# Patient Record
Sex: Female | Born: 1980 | Hispanic: Yes | Marital: Single
Health system: Midwestern US, Community
[De-identification: ages and names within clinical notes are randomized; demographics above are authoritative.]

## PROBLEM LIST (undated history)

## (undated) ENCOUNTER — Observation Stay: Admission: RE | Payer: Medicaid HMO | Source: Home / Self Care | Admitting: Obstetrics & Gynecology

## (undated) HISTORY — PX: APPENDECTOMY: SHX54

## (undated) HISTORY — PX: APPENDECTOMY (OPEN): SHX54

---

## 2007-12-06 LAB — URINALYSIS W/ RFLX MICROSCOPIC
Bilirubin: NEGATIVE
Blood: NEGATIVE
Glucose: NEGATIVE MG/DL
Leukocyte Esterase: NEGATIVE
Nitrites: NEGATIVE
Specific gravity: >1.03 — ABNORMAL HIGH (ref 1.003–1.030)
Urobilinogen: 1 EU/DL (ref 0.2–1.0)
pH (UA): 5.5 (ref 5.0–8.0)

## 2007-12-06 LAB — URINE MICROSCOPIC ONLY: WBC: 0 /HPF (ref 0–4)

## 2007-12-06 LAB — HCG URINE, QL: HCG urine, QL: NEGATIVE

## 2007-12-06 LAB — WET PREP: Wet prep: NONE SEEN

## 2007-12-08 LAB — CHLAMYDIA/GC PCR
Chlamydia amplified: NEGATIVE
N. gonorrhea, amplified: NEGATIVE

## 2010-10-20 NOTE — ED Notes (Signed)
Left rib pain for last 30 minutes, worse with movement or deep breath.

## 2010-10-20 NOTE — ED Notes (Signed)
MD at bedside

## 2010-10-20 NOTE — ED Provider Notes (Signed)
HPI Comments: 30 yo F presents to the ED C/O L sided rib pain that began earlier today. Pt states pain is worsened with deep breathing and twisiting torso to the left. Pt is 6 months pregnant and is followed by [facility]. Pt denies SOB and any other Sx or complaints.    Patient is a 30 y.o. female presenting with rib pain. The history is provided by the patient.   Rib Pain  This is a new problem. The current episode started 3 to 5 hours ago. The problem has not changed since onset.Pertinent negatives include no fever, no headaches, no rhinorrhea, no sore throat, no cough, no chest pain, no vomiting, no abdominal pain and no rash. She has tried nothing for the symptoms.        No past medical history on file.     Past Surgical History   Procedure Date   ??? Hx appendectomy          No family history on file.     History     Social History   ??? Marital Status: Single     Spouse Name: N/A     Number of Children: N/A   ??? Years of Education: N/A     Occupational History   ??? Not on file.     Social History Main Topics   ??? Smoking status: Not on file   ??? Smokeless tobacco: Not on file   ??? Alcohol Use:    ??? Drug Use:    ??? Sexually Active:      Other Topics Concern   ??? Not on file     Social History Narrative   ??? No narrative on file                  ALLERGIES: Review of patient's allergies indicates no known allergies.      Review of Systems   Constitutional: Negative for fever and fatigue.   HENT: Negative for sore throat and rhinorrhea.    Respiratory: Negative for cough and shortness of breath.    Cardiovascular: Negative for chest pain and palpitations.   Gastrointestinal: Negative for nausea, vomiting, abdominal pain and diarrhea.   Genitourinary: Negative for dysuria and difficulty urinating.   Musculoskeletal: Positive for myalgias and arthralgias.   Skin: Negative for color change and rash.   Neurological: Negative for light-headedness and headaches.   All other systems reviewed and are negative.        Filed Vitals:     10/20/10 2135 10/20/10 2229   BP: 101/61    Pulse: 78    Temp: 97.9 ??F (36.6 ??C)    Resp: 20    Height: 5\' 5"  (1.651 m)    Weight: 65.772 kg (145 lb)    SpO2: 100% 100%            Physical Exam   Nursing note and vitals reviewed.  Constitutional: She is oriented to person, place, and time. She appears well-developed and well-nourished.        Gravid F   HENT:   Head: Normocephalic and atraumatic.   Right Ear: External ear normal.   Left Ear: External ear normal.   Nose: Nose normal.   Mouth/Throat: Oropharynx is clear and moist.   Eyes: Conjunctivae and EOM are normal. Pupils are equal, round, and reactive to light.   Neck: Normal range of motion. Neck supple.   Cardiovascular: Normal rate, regular rhythm, normal heart sounds and intact distal pulses.  Pulmonary/Chest: Effort normal and breath sounds normal. No respiratory distress. She has no wheezes. She has no rales.        TTP over L lateral chest just above flank area, no swelling or induration   Abdominal: Soft. Bowel sounds are normal. She exhibits no distension. There is no tenderness. There is no rebound and no guarding.        Gravid abd, palpable fetal parts   Musculoskeletal: Normal range of motion. She exhibits no tenderness.   Neurological: She is alert and oriented to person, place, and time. She has normal reflexes.   Skin: Skin is warm and dry. No rash noted.   Psychiatric: She has a normal mood and affect. Her behavior is normal. Judgment and thought content normal.        MDM     Differential Diagnosis; Clinical Impression; Plan:     DDx:  Chest wall pain, GI, pleuritis, PTX    Gravid female with pleuritic left chest pain, no SOB. She had no relief with tylenol. She had relief with GI cocktail. She however was tender left chest wit palpation prior to medications. Pain is most likely chest wall pain, most likely pleurisy which is GI related.  Amount and/or Complexity of Data Reviewed:   Clinical lab tests:  Ordered and reviewed   Discussion of test results with the performing providers:  No   Decide to obtain previous medical records or to obtain history from someone other than the patient:  No   Obtain history from someone other than the patient:  No   Review and summarize past medical records:  Yes   Discuss the patient with another provider:  No   Independant visualization of image, tracing, or specimen:  Yes      Procedures    PROGRESS NOTES  Answered Pt and family questions. Written by Buel Ream, ED Scribe as dictated by Buelah Manis, MD, 10:18 PM    PROGRESS NOTE:   11:14 PM  Pt has been re-examined by Florestine Avers Salam Chesterfield, MD. Pt appears comfortable but states she still hurts when she moves, will try GI cocktail.  Written by Larey Days, ED Scribe, as dictated by Glynn Octave, MD.     PROGRESS NOTE:   11:48 PM  Pt has been re-examined by Buelah Manis, MD. Pt's pain resolved with GI cocktail, will D/C  Written by Larey Days, ED Scribe, as dictated by Glynn Octave, MD.       RESULTS  Labs Reviewed   CBC WITH AUTOMATED DIFF - Abnormal; Notable for the following:     RBC 3.23 (*)     HGB 10.3 (*)     HCT 29.6 (*)     All other components within normal limits   METABOLIC PANEL, BASIC - Abnormal; Notable for the following:     Sodium 132 (*)     All other components within normal limits   URINALYSIS W/ RFLX MICROSCOPIC       Recent Results (from the past 12 hour(s))   CBC WITH AUTOMATED DIFF    Collection Time    10/20/10 10:26 PM       Component Value Range    WBC 9.3  4.6 - 13.2 (K/uL)    RBC 3.23 (*) 4.20 - 5.30 (M/uL)    HGB 10.3 (*) 12.0 - 16.0 (g/dL)    HCT 16.1 (*) 09.6 - 45.0 (%)    MCV 91.6  74.0 - 97.0 (FL)  MCH 31.9  24.0 - 34.0 (PG)    MCHC 34.8  31.0 - 37.0 (g/dL)    RDW 09.8  11.9 - 14.7 (%)    PLATELET 183  135 - 420 (K/uL)    MPV 11.6  9.2 - 11.8 (FL)    NEUTROPHILS 66  40 - 73 (%)    LYMPHOCYTES 25  21 - 52 (%)    MONOCYTES 8  3 - 10 (%)    EOSINOPHILS 1  0 - 5 (%)     BASOPHILS 0  0 - 2 (%)    ABS. NEUTROPHILS 6.1  1.8 - 8.0 (K/UL)    ABS. LYMPHOCYTES 2.3  0.9 - 3.6 (K/UL)    ABS. MONOCYTES 0.8  0.05 - 1.2 (K/UL)    ABS. EOSINOPHILS 0.1  0.0 - 0.4 (K/UL)    ABS. BASOPHILS 0.0  0.0 - 0.06 (K/UL)    DF AUTOMATED     METABOLIC PANEL, BASIC    Collection Time    10/20/10 10:26 PM       Component Value Range    Sodium 132 (*) 136 - 145 (MMOL/L)    Potassium 3.6  3.5 - 5.5 (MMOL/L)    Chloride 102  100 - 108 (MMOL/L)    CO2 23  21 - 32 (MMOL/L)    Anion gap 7  5 - 15 (mmol/L)    Glucose 82  74 - 99 (MG/DL)    BUN 10  7 - 18 (MG/DL)    Creatinine 0.6  0.6 - 1.3 (MG/DL)    BUN/Creatinine ratio 17  12 - 20 ( )    GFR est AA >60  >60 (ml/min/1.22m2)    GFR est non-AA >60  >60 (ml/min/1.90m2)    Calcium 8.4  8.4 - 10.4 (MG/DL)   URINALYSIS W/ RFLX MICROSCOPIC    Collection Time    10/20/10 10:45 PM       Component Value Range    Color YELLOW      Appearance CLEAR      Specific gravity 1.025  1.003 - 1.030 ( )    pH 6.0  5.0 - 8.0 ( )    Protein NEGATIVE   NEGATIVE (MG/DL)    Glucose NEGATIVE   NEGATIVE (MG/DL)    Ketone NEGATIVE   NEGATIVE (MG/DL)    Bilirubin NEGATIVE   NEGATIVE     Blood NEGATIVE   NEGATIVE     Urobilinogen 0.2  0.2 - 1.0 (EU/DL)    Nitrites NEGATIVE   NEGATIVE     Leukocyte Esterase NEGATIVE   NEGATIVE        CLINICAL IMPRESSION    1. Chest wall pain    2. [redacted] weeks gestation of pregnancy         11:22 PM   Patients results have been reviewed with them.  Patient and/or family have verbally conveyed their understanding and agreement of the patient's signs, symptoms, diagnosis, treatment and prognosis and additionally agree to follow up as recommended or return to the Emergency Room should their condition change prior to their follow-up appointment. Patient verbally agrees with the care-plan and verbally conveys that all of their questions have been answered.   Discharge instructions have also been provided to the patient with some educational information regarding their diagnosis as well a list of reasons why they would want to return to the ER prior to their follow-up appointment should their condition change.

## 2010-10-20 NOTE — ED Notes (Signed)
Fetal heart tones 145

## 2010-10-20 NOTE — ED Notes (Signed)
Pt reports that medication worked

## 2010-10-21 LAB — CBC WITH AUTOMATED DIFF
ABS. BASOPHILS: 0 10*3/uL (ref 0.0–0.06)
ABS. EOSINOPHILS: 0.1 10*3/uL (ref 0.0–0.4)
ABS. LYMPHOCYTES: 2.3 10*3/uL (ref 0.9–3.6)
ABS. MONOCYTES: 0.8 10*3/uL (ref 0.05–1.2)
ABS. NEUTROPHILS: 6.1 10*3/uL (ref 1.8–8.0)
BASOPHILS: 0 % (ref 0–2)
EOSINOPHILS: 1 % (ref 0–5)
HCT: 29.6 % — ABNORMAL LOW (ref 35.0–45.0)
HGB: 10.3 g/dL — ABNORMAL LOW (ref 12.0–16.0)
LYMPHOCYTES: 25 % (ref 21–52)
MCH: 31.9 PG (ref 24.0–34.0)
MCHC: 34.8 g/dL (ref 31.0–37.0)
MCV: 91.6 FL (ref 74.0–97.0)
MONOCYTES: 8 % (ref 3–10)
MPV: 11.6 FL (ref 9.2–11.8)
NEUTROPHILS: 66 % (ref 40–73)
PLATELET: 183 10*3/uL (ref 135–420)
RBC: 3.23 M/uL — ABNORMAL LOW (ref 4.20–5.30)
RDW: 14 % (ref 11.6–14.5)
WBC: 9.3 10*3/uL (ref 4.6–13.2)

## 2010-10-21 LAB — METABOLIC PANEL, BASIC
Anion gap: 7 mmol/L (ref 5–15)
BUN/Creatinine ratio: 17 (ref 12–20)
BUN: 10 MG/DL (ref 7–18)
CO2: 23 MMOL/L (ref 21–32)
Calcium: 8.4 MG/DL (ref 8.4–10.4)
Chloride: 102 MMOL/L (ref 100–108)
Creatinine: 0.6 MG/DL (ref 0.6–1.3)
GFR est AA: >60 mL/min/{1.73_m2} (ref >60)
GFR est non-AA: >60 mL/min/{1.73_m2} (ref >60)
Glucose: 82 MG/DL (ref 74–99)
Potassium: 3.6 MMOL/L (ref 3.5–5.5)
Sodium: 132 MMOL/L — ABNORMAL LOW (ref 136–145)

## 2010-10-21 LAB — URINALYSIS W/ RFLX MICROSCOPIC
Bilirubin: NEGATIVE
Blood: NEGATIVE
Glucose: NEGATIVE MG/DL
Ketone: NEGATIVE MG/DL
Leukocyte Esterase: NEGATIVE
Nitrites: NEGATIVE
Protein: NEGATIVE MG/DL
Specific gravity: 1.025 (ref 1.003–1.030)
Urobilinogen: 0.2 EU/DL (ref 0.2–1.0)
pH (UA): 6 (ref 5.0–8.0)

## 2010-10-21 MED ORDER — ACETAMINOPHEN 325 MG TABLET
325 mg | ORAL | Status: AC
Start: 2010-10-21 — End: 2010-10-20
  Administered 2010-10-21: 02:00:00 via ORAL

## 2010-10-21 MED ADMIN — GI COCKTAIL (MIH CMPD): ORAL | @ 04:00:00 | NDC 99999180430

## 2013-12-30 DIAGNOSIS — R252 Cramp and spasm: Secondary | ICD-10-CM

## 2013-12-30 NOTE — ED Notes (Signed)
"  having a lot of cramping and spotting with a smell to it"

## 2013-12-31 ENCOUNTER — Inpatient Hospital Stay
Admit: 2013-12-31 | Discharge: 2013-12-31 | Disposition: A | Discharge status: Home / Self Care | Payer: Self-pay | Attending: Emergency Medicine

## 2013-12-31 LAB — URINALYSIS W/ RFLX MICROSCOPIC
Bilirubin: NEGATIVE
Glucose: NEGATIVE mg/dL
Ketone: NEGATIVE mg/dL
Leukocyte Esterase: NEGATIVE
Nitrites: NEGATIVE
Protein: NEGATIVE mg/dL
Specific gravity: 1.015 (ref 1.003–1.030)
Urobilinogen: 0.2 EU/dL (ref 0.2–1.0)
pH (UA): 7 (ref 5.0–8.0)

## 2013-12-31 LAB — WET PREP
Wet prep: ABSENT
Wet prep: NONE SEEN
Wet prep: NONE SEEN

## 2013-12-31 LAB — HCG URINE, QL: HCG urine, QL: NEGATIVE

## 2013-12-31 LAB — URINE MICROSCOPIC ONLY
Bacteria: NEGATIVE /hpf
RBC: 0 /hpf (ref 0–5)
WBC: 0 /hpf (ref 0–4)

## 2013-12-31 NOTE — ED Notes (Signed)
I have reviewed discharge instructions with the patient.  The patient verbalized understanding. Patient armband removed and shredded.

## 2013-12-31 NOTE — ED Provider Notes (Signed)
Patient is a 33 y.o. female presenting with abdominal pain. The history is provided by the patient.   Abdominal Pain     33 y/o female presents with c/o vaginal spotting and abdominal cramping. Denies discharge or vaginal pain. States started her menses 12/12/13 but started spotting again 2 days ago.    History reviewed. No pertinent past medical history.;     Past Surgical History   Procedure Laterality Date   ??? Hx appendectomy           History reviewed. No pertinent family history.     History     Social History   ??? Marital Status: SINGLE     Spouse Name: N/A     Number of Children: N/A   ??? Years of Education: N/A     Occupational History   ??? Not on file.     Social History Main Topics   ??? Smoking status: Not on file   ??? Smokeless tobacco: Not on file   ??? Alcohol Use: Not on file   ??? Drug Use: Not on file   ??? Sexual Activity: Not on file     Other Topics Concern   ??? Not on file     Social History Narrative                ALLERGIES: Review of patient's allergies indicates no known allergies.      Review of Systems   Gastrointestinal: Positive for abdominal pain.   Constitutional:  Denies malaise, fever, chills.   Head:  Denies injury.   Face:  Denies injury or pain.   ENMT:  Denies sore throat.   Neck:  Denies injury or pain.   Chest:  Denies injury.   Cardiac:  Denies chest pain or palpitations.   Respiratory:  Denies cough, wheezing, difficulty breathing, shortness of breath.   GI/ABD:  Cramping  GU:  spotting  Back:  Denies injury or pain.   Pelvis:  Denies injury or pain.   Extremity/MS:  Denies injury or pain.   Neuro:  Denies headache, LOC, dizziness, neurologic symptoms/deficits/paresthesias.   Skin: Denies injury, rash, itching or skin changes.      Filed Vitals:    12/30/13 2353   BP: 118/70   Pulse: 73   Temp: 98 ??F (36.7 ??C)   Resp: 16   SpO2: 100%            Physical Exam   Nursing note and vitals reviewed.  CONSTITUTIONAL: Alert, in no apparent distress; well-developed; well-nourished.    HEAD:  Normocephalic, atraumatic.   EYES: PERRL; EOM's intact.   ENTM: Nose: No rhinorrhea; Throat: mucous membranes moist. Posterior pharynx-normal.  Neck:  No JVD, supple without lymphadenopathy.  RESP: Chest clear, equal breath sounds.   CV: S1 and S2 WNL; No murmurs, gallops or rubs.   GI: Abdomen soft and non-tender. No masses or organomegaly.   UPPER EXT:  Normal inspection.   LOWER EXT: Normal inspection.  NEURO: CN II-XII grossly intact, strength 5/5 and sym, sensation intact.   SKIN: No rashes; Normal for age and stage.   PSYCH:  Alert and oriented, normal affect.      MDM  Number of Diagnoses or Management Options  Diagnosis management comments: Diagnosis management comments: DDX: Pyelonephritis, Acute Cystitis, Hemorrhagic Cystitis, Interstitial Cystitis, Pelvic Inflammatory Disease, Ovarian cyst pain, Nonspecific urethritis (chlamydia or ureaplasma), renal colic, Bladder outlet obstruction, Neurogenic bladder, Pelvic pain disorder, Endometriosis, Vaginitis, Urosepsis, Nephrolithiasis, Urolithiasis, Monolial Vaginitis, Trichomoniasis.  IMPRESSION AND MEDICAL DECISION MAKING:  Based upon the patient's presentation with noted HPI and PE, along with the work up done in the emergency department, I believe that the patient   Is having minimal  vaginal bleeding and cramping for unknown reason. Review of labs and pelvic exam showed no gross abnormality except for minimal bleeding. Pt states that she feels better at this time.  Will have her follow up with GYN within the next 48 hours.       Amount and/or Complexity of Data Reviewed  Clinical lab tests: ordered and reviewed  Tests in the medicine section of CPT??: ordered and reviewed        Pelvic Exam  Date/Time: 12/31/2013 1:13 AM  Exam assisted by:  tech.  Type of exam performed: bimanual and speculum.    External genitalia appearance: normal.    Vaginal exam:  bleeding and odor.    Bleeding: mild  Cervical exam:  normal.    Specimen(s) collected:  GC.   Bimanual exam:  normal.    Patient tolerance: Patient tolerated the procedure well with no immediate complications

## 2013-12-31 NOTE — ED Notes (Signed)
Pt being discharged w/ full understanding of D/C instructions. Given both verbal and written information. Pt understands to F/U in ED for any new or worsening symptoms. Pt leaving ED now in stable condition, ambulatory, escorted out by family. Pt w/ no further questions or concerns at this time.

## 2014-01-01 LAB — CHLAMYDIA/GC PCR
Chlamydia amplified: NEGATIVE
N. gonorrhea, amplified: NEGATIVE

## 2016-05-09 ENCOUNTER — Encounter: Payer: Self-pay | Admitting: Obstetrics & Gynecology

## 2016-05-09 ENCOUNTER — Observation Stay: Payer: Medicaid HMO | Admitting: Obstetrics & Gynecology

## 2016-05-09 ENCOUNTER — Observation Stay
Admission: RE | Admit: 2016-05-09 | Discharge: 2016-05-09 | Disposition: A | Discharge status: Home / Self Care | Payer: Medicaid HMO | Attending: Obstetrics & Gynecology | Admitting: Obstetrics & Gynecology

## 2016-05-09 ENCOUNTER — Observation Stay: Payer: Medicaid HMO

## 2016-05-09 DIAGNOSIS — O471 False labor at or after 37 completed weeks of gestation: Principal | ICD-10-CM | POA: Insufficient documentation

## 2016-05-09 DIAGNOSIS — Z3490 Encounter for supervision of normal pregnancy, unspecified, unspecified trimester: Secondary | ICD-10-CM

## 2016-05-09 DIAGNOSIS — O0933 Supervision of pregnancy with insufficient antenatal care, third trimester: Secondary | ICD-10-CM | POA: Insufficient documentation

## 2016-05-09 DIAGNOSIS — Z3A37 37 weeks gestation of pregnancy: Secondary | ICD-10-CM | POA: Insufficient documentation

## 2016-05-09 LAB — CBC AND DIFFERENTIAL
Absolute NRBC: 0 10*3/uL
Basophils Absolute Automated: 0.03 10*3/uL (ref 0.00–0.20)
Basophils Automated: 0.3 %
Eosinophils Absolute Automated: 0.03 10*3/uL (ref 0.00–0.70)
Eosinophils Automated: 0.3 %
Hematocrit: 36.2 % — ABNORMAL LOW (ref 37.0–47.0)
Hgb: 12.2 g/dL (ref 12.0–16.0)
Immature Granulocytes Absolute: 0.11 10*3/uL — ABNORMAL HIGH
Immature Granulocytes: 1.2 %
Lymphocytes Absolute Automated: 1.63 10*3/uL (ref 0.50–4.40)
Lymphocytes Automated: 17.5 %
MCH: 32.3 pg — ABNORMAL HIGH (ref 28.0–32.0)
MCHC: 33.7 g/dL (ref 32.0–36.0)
MCV: 95.8 fL (ref 80.0–100.0)
MPV: 12.3 fL (ref 9.4–12.3)
Monocytes Absolute Automated: 0.61 10*3/uL (ref 0.00–1.20)
Monocytes: 6.5 %
Neutrophils Absolute: 6.93 10*3/uL (ref 1.80–8.10)
Neutrophils: 74.2 %
Nucleated RBC: 0 /100 WBC (ref 0.0–1.0)
Platelets: 212 10*3/uL (ref 140–400)
RBC: 3.78 10*6/uL — ABNORMAL LOW (ref 4.20–5.40)
RDW: 13 % (ref 12–15)
WBC: 9.34 10*3/uL (ref 3.50–10.80)

## 2016-05-09 LAB — URINALYSIS WITH MICROSCOPIC
Bilirubin, UA: NEGATIVE
Glucose, UA: NEGATIVE
Nitrite, UA: NEGATIVE
Protein, UR: NEGATIVE
Specific Gravity UA: 1.02 (ref 1.001–1.035)
Urine pH: 7 (ref 5.0–8.0)
Urobilinogen, UA: 4 mg/dL — AB

## 2016-05-09 LAB — RAPID DRUG SCREEN, URINE
Barbiturate Screen, UR: NEGATIVE
Benzodiazepine Screen, UR: NEGATIVE
Cannabinoid Screen, UR: NEGATIVE
Cocaine, UR: NEGATIVE
Opiate Screen, UR: NEGATIVE
PCP Screen, UR: NEGATIVE
Urine Amphetamine Screen: NEGATIVE

## 2016-05-09 LAB — TYPE AND SCREEN
AB Screen Gel: NEGATIVE
ABO Rh: A POS

## 2016-05-09 LAB — HIV RAPID
Rapid HIV-1 p24 Antigen: NONREACTIVE
Rapid HIV-1/2  Antibody: NONREACTIVE

## 2016-05-09 LAB — SICKLE CELL SCREEN: Sickle Screen Test: NEGATIVE

## 2016-05-09 LAB — HEMOLYSIS INDEX: Hemolysis Index: 2 (ref 0–18)

## 2016-05-09 NOTE — Progress Notes (Signed)
Sono and some lab results reviewed by Dr Lynann Bologna. Pt left without waiting for the rest of the lab results. Dr Lynann Bologna aware. No new orders received.

## 2016-05-09 NOTE — Progress Notes (Signed)
ANTEPARTUM PROGRESS NOTE    Date Time: 05/09/16 7:11 PM LOS:0   Room#ACDU/ACDU-03    Principal Problem:   Patient is a 36 y.o., @ [redacted]w[redacted]d with abdominal pressure.  OB History     Gravida Para Term Preterm AB Living    4 2   2   3     SAB TAB Ectopic Multiple Live Births                        Subjective:   Denies Vaginal Bleeding, Contractions,and Leaking of fluid. Reports good fetal movement.    Objective:   Patient Vitals for the past 24 hrs:   BP Temp Temp src Pulse Resp Height Weight   05/09/16 1832 117/70 98.2 F (36.8 C) Oral (!) 103 15 - -   05/09/16 1830 - - Oral - - 1.651 m (5\' 5" ) 85.7 kg (189 lb)   05/09/16 1829 - - - - - 1.651 m (5\' 5" ) -     Fetal Heart Baseline Rate:  (fetus active, constantly moving)  Weight: 85.7 kg (189 lb)  Weight change:     General appearance - alert, well appearing, and in no distress and oriented to person, place, and time  Mental status - alert, oriented to person, place, and time, normal mood, behavior, speech, dress, motor activity, and thought processes  Chest - clear to auscultation, no wheezes, rales or rhonchi, symmetric air entry, no tachypnea, retractions or cyanosis  Heart - normal rate, regular rhythm, normal S1, S2, no murmurs, rubs, clicks or gallops  Abdomen - soft, nontender, nondistended, no masses or organomegaly  no rebound tenderness noted  Breasts - not examined  Pelvic - dil close  Rectal - rectal exam not indicated  Back exam - not examined  Extremities - feet normal, good pulses, normal color, temperature and sensation, no edema, redness or tenderness in the calves or thighs, Homan's sign negative bilaterally  Skin - normal coloration and turgor, no rashes, no suspicious skin lesions noted    Last Cervical Exam:       Current Labs   No results found for: [facility]  Results     Procedure Component Value Units Date/Time    Urinalysis with microscopic [[id number]]  (Abnormal) Collected:  05/09/16 1822    Specimen:  Urine Updated:  05/09/16 1840     Urine Type  Clean Catch     Color, UA Yellow     Clarity, UA Sl Cloudy (A)     Specific Gravity UA 1.020     Urine pH 7.0     Leukocyte Esterase, UA Small (A)     Nitrite, UA Negative     Protein, UR Negative     Glucose, UA Negative     Ketones UA Trace (A)     Urobilinogen, UA 4.0 (A) mg/dL      Bilirubin, UA Negative     Blood, UA Small (A)     RBC, UA 3 - 5 /hpf      WBC, UA 0 - 5 /hpf      Squamous Epithelial Cells, Urine 0 - 5 /hpf      Urine Amorphous Occasional /hpf         @FLOW [[id number]]@    Recent Sono:   Date:     EFW:   %ile   AC:    S/D:    MCA:   MoM   AFI/MVP:     Position:    Placenta:  Cervical Length:    cm     Antenatal Testing:   NON-STRESS TEST:                                           BPP:       Meds   Did the patient receive 2 doses of AP steroids administered?: No        No current facility-administered medications for this encounter.        Assessment      Early labor  Patient Active Problem List   Diagnosis   . Pregnancy       Plan   Observation x 1 hr if no change D/C Home

## 2016-05-09 NOTE — Discharge Instr - AVS First Page (Signed)
Reason for your Hospital Admission:  Abdominal Pressure      Instructions for after your discharge:  Call your doctor if:   Contractions become stronger and regular every 5 minutes for one hour.   Bag of water ruptures. (Sudden gush of fluid or continuous leak)   Vaginal bleeding occurs (bright red, running down legs or passed blood clots).    Spotting after a vaginal exam or mucous bloody show is normal.   You experience any unusually sharp abdominal pain.   You notice a decrease in fetal movement.  If you notice a decrease in the fetal movement.  Get something to eat and drink, lay on your side, place you had on your abdomen and perform your fetal kick counts.  Have this information available for your MD.   You have persistent severe headaches.   You have blurred vision or spots before the eyes.   You have persistent nausea and vomiting.   You have sudden increase swelling of hands, feet, face, and ankles (if suddenly your rings are too tight).   You have chills and a fever equalled to or greater than 100.4 (not accompanied by a cold).   You have very frequent urination or burning with urination.   You have further concerns or questions.

## 2016-05-09 NOTE — Progress Notes (Signed)
Verbal and written discharge instructions given to pt by this nurse. All pt's questions and concerns addressed by this nurse. Pt verbalized understanding of discharge instructions.

## 2016-05-09 NOTE — Progress Notes (Signed)
Dr Lynann Bologna requested to call pt and tell her to return to L&D triage for sonogram and no prenatal care labs. Pt called to come back in. Pt at bedside now.

## 2016-05-10 LAB — RPR (REFLEX TO TITER AND CONFIRMATION): RPR: NONREACTIVE

## 2016-05-10 LAB — RUBELLA ANTIBODY, IGG: Rubella AB, IgG: 2.28

## 2016-05-10 LAB — HEPATITIS B SURFACE ANTIGEN W/ REFLEX TO CONFIRMATION: Hepatitis B Surface Antigen: NONREACTIVE

## 2017-03-06 ENCOUNTER — Encounter (HOSPITAL_BASED_OUTPATIENT_CLINIC_OR_DEPARTMENT_OTHER): Payer: Self-pay

## 2019-12-27 ENCOUNTER — Other Ambulatory Visit: Payer: Self-pay | Admitting: Physician Assistant

## 2019-12-27 ENCOUNTER — Other Ambulatory Visit: Payer: Self-pay | Admitting: Orthopedic Surgery

## 2019-12-27 DIAGNOSIS — G8929 Other chronic pain: Secondary | ICD-10-CM

## 2019-12-27 DIAGNOSIS — M5126 Other intervertebral disc displacement, lumbar region: Secondary | ICD-10-CM

## 2019-12-27 DIAGNOSIS — M542 Cervicalgia: Secondary | ICD-10-CM

## 2019-12-27 DIAGNOSIS — M5415 Radiculopathy, thoracolumbar region: Secondary | ICD-10-CM

## 2019-12-27 DIAGNOSIS — M546 Pain in thoracic spine: Secondary | ICD-10-CM

## 2019-12-27 DIAGNOSIS — R200 Anesthesia of skin: Secondary | ICD-10-CM

## 2020-01-17 ENCOUNTER — Other Ambulatory Visit: Payer: Self-pay

## 2020-01-20 ENCOUNTER — Ambulatory Visit
Admission: RE | Admit: 2020-01-20 | Discharge: 2020-01-20 | Disposition: A | Discharge status: Home / Self Care | Payer: Medicaid Other | Source: Ambulatory Visit | Attending: Orthopedic Surgery | Admitting: Orthopedic Surgery

## 2020-01-20 ENCOUNTER — Other Ambulatory Visit: Payer: Self-pay

## 2020-01-20 ENCOUNTER — Ambulatory Visit
Admission: RE | Admit: 2020-01-20 | Discharge: 2020-01-20 | Disposition: A | Discharge status: Home / Self Care | Payer: Medicaid Other | Source: Ambulatory Visit | Attending: *Deleted | Admitting: *Deleted

## 2020-01-20 DIAGNOSIS — M546 Pain in thoracic spine: Secondary | ICD-10-CM

## 2020-01-20 DIAGNOSIS — M5126 Other intervertebral disc displacement, lumbar region: Secondary | ICD-10-CM

## 2020-01-20 DIAGNOSIS — M542 Cervicalgia: Secondary | ICD-10-CM

## 2020-01-20 DIAGNOSIS — M5415 Radiculopathy, thoracolumbar region: Secondary | ICD-10-CM

## 2020-01-20 DIAGNOSIS — R200 Anesthesia of skin: Secondary | ICD-10-CM

## 2020-01-20 DIAGNOSIS — G8929 Other chronic pain: Secondary | ICD-10-CM

## 2020-05-09 ENCOUNTER — Emergency Department (HOSPITAL_COMMUNITY)
Admission: EM | Admit: 2020-05-09 | Discharge: 2020-05-10 | Disposition: A | Discharge status: Home / Self Care | Payer: Medicaid Other | Attending: Emergency Medicine | Admitting: Emergency Medicine

## 2020-05-09 ENCOUNTER — Emergency Department (HOSPITAL_COMMUNITY): Payer: Medicaid Other

## 2020-05-09 ENCOUNTER — Other Ambulatory Visit: Payer: Self-pay

## 2020-05-09 DIAGNOSIS — R221 Localized swelling, mass and lump, neck: Secondary | ICD-10-CM | POA: Insufficient documentation

## 2020-05-09 DIAGNOSIS — M542 Cervicalgia: Secondary | ICD-10-CM | POA: Diagnosis not present

## 2020-05-09 DIAGNOSIS — Z5321 Procedure and treatment not carried out due to patient leaving prior to being seen by health care provider: Secondary | ICD-10-CM | POA: Diagnosis not present

## 2020-05-09 LAB — CBC WITH DIFFERENTIAL/PLATELET
Abs Immature Granulocytes: 0.02 10*3/uL (ref 0.00–0.07)
Basophils Absolute: 0.1 10*3/uL (ref 0.0–0.1)
Basophils Relative: 1 %
Eosinophils Absolute: 0.1 10*3/uL (ref 0.0–0.5)
Eosinophils Relative: 1 %
HCT: 38.9 % (ref 36.0–46.0)
Hemoglobin: 12.9 g/dL (ref 12.0–15.0)
Immature Granulocytes: 0 %
Lymphocytes Relative: 30 %
Lymphs Abs: 2.4 10*3/uL (ref 0.7–4.0)
MCH: 30.4 pg (ref 26.0–34.0)
MCHC: 33.2 g/dL (ref 30.0–36.0)
MCV: 91.5 fL (ref 80.0–100.0)
Monocytes Absolute: 0.6 10*3/uL (ref 0.1–1.0)
Monocytes Relative: 7 %
Neutro Abs: 5 10*3/uL (ref 1.7–7.7)
Neutrophils Relative %: 61 %
Platelets: 228 10*3/uL (ref 150–400)
RBC: 4.25 MIL/uL (ref 3.87–5.11)
RDW: 12.5 % (ref 11.5–15.5)
WBC: 8.2 10*3/uL (ref 4.0–10.5)
nRBC: 0 % (ref 0.0–0.2)

## 2020-05-09 LAB — COMPREHENSIVE METABOLIC PANEL
ALT: 19 U/L (ref 0–44)
AST: 18 U/L (ref 15–41)
Albumin: 4.3 g/dL (ref 3.5–5.0)
Alkaline Phosphatase: 56 U/L (ref 38–126)
Anion gap: 6 (ref 5–15)
BUN: 8 mg/dL (ref 6–20)
CO2: 25 mmol/L (ref 22–32)
Calcium: 9.3 mg/dL (ref 8.9–10.3)
Chloride: 105 mmol/L (ref 98–111)
Creatinine, Ser: 0.74 mg/dL (ref 0.44–1.00)
GFR, Estimated: >60 mL/min (ref >60)
Glucose, Bld: 101 mg/dL — ABNORMAL HIGH (ref 70–99)
Potassium: 3.8 mmol/L (ref 3.5–5.1)
Sodium: 136 mmol/L (ref 135–145)
Total Bilirubin: 0.6 mg/dL (ref 0.3–1.2)
Total Protein: 7.5 g/dL (ref 6.5–8.1)

## 2020-05-09 LAB — I-STAT BETA HCG BLOOD, ED (MC, WL, AP ONLY): I-stat hCG, quantitative: <5 m[IU]/mL (ref <5)

## 2020-05-09 MED ORDER — IOHEXOL 300 MG/ML  SOLN
75.0000 mL | Freq: Once | INTRAMUSCULAR | Status: AC | PRN
  Administered 2020-05-09: 75 mL via INTRAVENOUS

## 2020-05-09 NOTE — ED Provider Notes (Addendum)
MSE was initiated and I personally evaluated the patient and placed orders (if any) at  7:49 PM on May 09, 2020.  The patient appears stable so that the remainder of the MSE may be completed by another provider.  Patient reports that when she sleeps she feels like something is choking her on her neck.  She reports that she has a new mass on the left side of her neck.   Patient has an obvious mass on the left side of her neck.  She has no allergies.  Asked RN to place IV in triage to facilitate CT scan.  Given her feelings of this worsening I think its appropriate to start this from triage.  Currently she does not have airway impingement.   Initiation of care has begun. The patient has been counseled on the process, plan, and necessity for staying for the completion/evaluation, and the remainder of the medical screening examination.     Cristina Gong, PA-C 05/09/20 2005    [address] PA-C 05/09/20 2023    Gerhard Munch, MD 05/09/20 2131

## 2020-05-09 NOTE — ED Triage Notes (Signed)
Pt reports a huge knot on her neck notice the knot today. Pt states she been feeling like she is being choke especially after she swallows

## 2020-05-12 ENCOUNTER — Other Ambulatory Visit: Payer: Self-pay | Admitting: Physician Assistant

## 2020-05-12 DIAGNOSIS — R1310 Dysphagia, unspecified: Secondary | ICD-10-CM

## 2020-05-12 DIAGNOSIS — E079 Disorder of thyroid, unspecified: Secondary | ICD-10-CM

## 2020-05-20 ENCOUNTER — Other Ambulatory Visit: Payer: Self-pay | Admitting: Physician Assistant

## 2020-05-20 DIAGNOSIS — R1310 Dysphagia, unspecified: Secondary | ICD-10-CM

## 2020-05-20 DIAGNOSIS — E0789 Other specified disorders of thyroid: Secondary | ICD-10-CM

## 2020-05-20 DIAGNOSIS — E079 Disorder of thyroid, unspecified: Secondary | ICD-10-CM

## 2020-05-21 ENCOUNTER — Ambulatory Visit
Admission: RE | Admit: 2020-05-21 | Discharge: 2020-05-21 | Disposition: A | Discharge status: Home / Self Care | Payer: Medicaid Other | Source: Ambulatory Visit | Attending: Physician Assistant | Admitting: Physician Assistant

## 2020-05-21 DIAGNOSIS — E0789 Other specified disorders of thyroid: Secondary | ICD-10-CM

## 2020-05-21 DIAGNOSIS — E079 Disorder of thyroid, unspecified: Secondary | ICD-10-CM

## 2020-05-21 DIAGNOSIS — R1310 Dysphagia, unspecified: Secondary | ICD-10-CM

## 2020-05-22 NOTE — H&P (Signed)
HPI:   Erika Davenport is a 40 y.o. female who presents as a consult Patient.   Referring Provider: Vertis Kelch, PA-C  Chief complaint: Lump in the neck.  HPI: She has been having some trouble swallowing for the past couple of months and noticed a lump in her neck also. She went to emergency department recently had a CT scan that revealed a large thyroid mass on the left. She had never noticed it before. Otherwise in good health. She denies any reflux symptoms or risk factors.  PMH/Meds/All/SocHx/FamHx/ROS:   History reviewed. No pertinent past medical history.  Past Surgical History:  Procedure Laterality Date  . APPENDECTOMY   No family history of bleeding disorders, wound healing problems or difficulty with anesthesia.   Social History   Socioeconomic History  . Marital status: Unknown  Spouse name: Not on file  . Number of children: Not on file  . Years of education: Not on file  . Highest education level: Not on file  Occupational History  . Not on file  Tobacco Use  . Smoking status: Former Games developer  . Smokeless tobacco: Never Used  Vaping Use  . Vaping Use: Never used  Substance and Sexual Activity  . Alcohol use: Not on file  . Drug use: Not on file  . Sexual activity: Not on file  Other Topics Concern  . Not on file  Social History Narrative  . Not on file   Social Determinants of Health   Financial Resource Strain: Not on file  Food Insecurity: Not on file  Transportation Needs: Not on file  Physical Activity: Not on file  Stress: Not on file  Social Connections: Not on file  Housing Stability: Not on file   No current outpatient medications on file.  A complete ROS was performed with pertinent positives/negatives noted in the HPI. The remainder of the ROS are negative.   Physical Exam:   BP 115/69  Pulse 73  Temp 97.1 F (36.2 C)  Ht 1.651 m (5\' 5" )  Wt 99.3 kg (219 lb)  BMI 36.44 kg/m   General: Healthy and alert, in no distress,  breathing easily. Normal affect. In a pleasant mood. Head: Normocephalic, atraumatic. No masses, or scars. Eyes: Pupils are equal, and reactive to light. Vision is grossly intact. No spontaneous or gaze nystagmus. Ears: Ear canals are clear. Tympanic membranes are intact, with normal landmarks and the middle ears are clear and healthy. Hearing: Grossly normal. Nose: Nasal cavities are clear with healthy mucosa, no polyps or exudate. Airways are patent. Face: No masses or scars, facial nerve function is symmetric. Oral Cavity: No mucosal abnormalities are noted. Tongue with normal mobility. Dentition appears healthy. Oropharynx: Tonsils are symmetric. There are no mucosal masses identified. Tongue base appears normal and healthy. Larynx/Hypopharynx: indirect exam reveals healthy, mobile vocal cords, without mucosal lesions in the hypopharynx or larynx. Chest: Deferred Neck: The left lobe of the thyroid is diffusely enlarged, approximately 7 or 8 cm in greatest dimension. Its soft and slightly rubbery. No definite palpable mass on the right. There is no adenopathy palpable. Neuro: Cranial nerves II-XII with normal function. Balance: Normal gate. Other findings: none.  Independent Review of Additional Tests or Records:  CT neck: IMPRESSION:  1. 3.5 x 4.0 x 4.2 cm left thyroid hypodense nodule. Recommend  thyroid (ref: J Am Coll Radiol. 2015 Feb;12(2): 143-50).  2. No significant adenopathy.   Procedures:  none  Impression & Plans:  Large left thyroid nodule. This may  be causing compressive symptoms and may be responsible for her trouble swallowing. She has no other good explanation for that. We discussed that I would normally perform ultrasound and FNA to further characterize this but based on the size and her symptoms I think her only option is going to be surgical excision of this. Recommend left thyroid lobectomy with frozen section and possible right side surgery. She would like to  wait until after May 14 because she has graduation from her online college. I think that is reasonable. She can certainly contact me sooner if anything gets much worse. Risks of surgery were discussed including hypocalcemia and recurrent nerve injury. We discussed overnight stay in the hospital and the need for drain and the transverse incision. She understands and agrees.

## 2020-05-26 ENCOUNTER — Other Ambulatory Visit (HOSPITAL_COMMUNITY)
Admission: RE | Admit: 2020-05-26 | Discharge: 2020-05-26 | Disposition: A | Discharge status: Home / Self Care | Payer: Medicaid Other | Source: Ambulatory Visit | Attending: Otolaryngology | Admitting: Otolaryngology

## 2020-05-26 ENCOUNTER — Other Ambulatory Visit: Payer: Medicaid Other

## 2020-05-26 DIAGNOSIS — Z20822 Contact with and (suspected) exposure to covid-19: Secondary | ICD-10-CM | POA: Diagnosis not present

## 2020-05-26 DIAGNOSIS — Z01812 Encounter for preprocedural laboratory examination: Secondary | ICD-10-CM | POA: Diagnosis not present

## 2020-05-26 LAB — SARS CORONAVIRUS 2 (TAT 6-24 HRS): SARS Coronavirus 2: NEGATIVE

## 2020-05-27 ENCOUNTER — Encounter (HOSPITAL_COMMUNITY): Payer: Self-pay | Admitting: Otolaryngology

## 2020-05-27 ENCOUNTER — Other Ambulatory Visit: Payer: Self-pay

## 2020-05-27 NOTE — Progress Notes (Signed)
PCP - Prudy Feeler PA-C Cardiologist -   Chest x-ray -  EKG -  Stress Test -  ECHO -  Cardiac Cath -   COVID TEST- 05/26/20 negative   Anesthesia review: n/a  -------------  SDW INSTRUCTIONS:  Your procedure is scheduled on 05/28/20. Please report to [facility] Main Entrance "A" at 0715 A.M., and check in at the Admitting office. Call this number if you have problems the morning of surgery: [phone]   Remember: Do not eat or drink after midnight the night before your surgery    No medications needed morning of surgery   As of today, STOP taking any Aspirin (unless otherwise instructed by your surgeon), Aleve, Naproxen, Ibuprofen, Motrin, Advil, Goody's, BC's, all herbal medications, fish oil, and all vitamins.    The Morning of Surgery Do not wear jewelry, make-up or nail polish. Do not wear lotions, powders, or perfumes, or deodorant Do not shave 48 hours prior to surgery.   Do not bring valuables to the hospital. [facility] is not responsible for any belongings or valuables. If you are a smoker, DO NOT Smoke 24 hours prior to surgery If you wear a CPAP at night please bring your mask the morning of surgery  Remember that you must have someone to transport you home after your surgery, and remain with you for 24 hours if you are discharged the same day. Please bring cases for contacts, glasses, hearing aids, dentures or bridgework because it cannot be worn into surgery.   Patients discharged the day of surgery will not be allowed to drive home.   Please shower the NIGHT BEFORE SURGERY and the MORNING OF SURGERY with DIAL Soap. Wear comfortable clothes the morning of surgery. Oral Hygiene is also important to reduce your risk of infection.  Remember - BRUSH YOUR TEETH THE MORNING OF SURGERY WITH YOUR REGULAR TOOTHPASTE  Patient denies shortness of breath, fever, cough and chest pain.

## 2020-05-28 ENCOUNTER — Encounter (HOSPITAL_COMMUNITY)
Admission: RE | Disposition: A | Discharge status: Home / Self Care | Payer: Self-pay | Source: Ambulatory Visit | Attending: Otolaryngology

## 2020-05-28 ENCOUNTER — Other Ambulatory Visit: Payer: Self-pay

## 2020-05-28 ENCOUNTER — Ambulatory Visit (HOSPITAL_COMMUNITY): Payer: Medicaid Other | Admitting: Anesthesiology

## 2020-05-28 ENCOUNTER — Encounter (HOSPITAL_COMMUNITY): Payer: Self-pay | Admitting: Otolaryngology

## 2020-05-28 ENCOUNTER — Observation Stay (HOSPITAL_COMMUNITY)
Admission: RE | Admit: 2020-05-28 | Discharge: 2020-05-29 | Disposition: A | Discharge status: Home / Self Care | Payer: Medicaid Other | Source: Ambulatory Visit | Attending: Otolaryngology | Admitting: Otolaryngology

## 2020-05-28 DIAGNOSIS — E065 Other chronic thyroiditis: Secondary | ICD-10-CM | POA: Insufficient documentation

## 2020-05-28 DIAGNOSIS — E890 Postprocedural hypothyroidism: Secondary | ICD-10-CM

## 2020-05-28 DIAGNOSIS — E041 Nontoxic single thyroid nodule: Principal | ICD-10-CM | POA: Insufficient documentation

## 2020-05-28 DIAGNOSIS — E079 Disorder of thyroid, unspecified: Secondary | ICD-10-CM | POA: Diagnosis present

## 2020-05-28 DIAGNOSIS — Z87891 Personal history of nicotine dependence: Secondary | ICD-10-CM | POA: Diagnosis not present

## 2020-05-28 HISTORY — PX: THYROIDECTOMY: SHX17

## 2020-05-28 LAB — CBC
HCT: 38.5 % (ref 36.0–46.0)
Hemoglobin: 12.7 g/dL (ref 12.0–15.0)
MCH: 30.2 pg (ref 26.0–34.0)
MCHC: 33 g/dL (ref 30.0–36.0)
MCV: 91.4 fL (ref 80.0–100.0)
Platelets: 239 10*3/uL (ref 150–400)
RBC: 4.21 MIL/uL (ref 3.87–5.11)
RDW: 12.5 % (ref 11.5–15.5)
WBC: 5.7 10*3/uL (ref 4.0–10.5)
nRBC: 0 % (ref 0.0–0.2)

## 2020-05-28 SURGERY — THYROIDECTOMY
Anesthesia: General | Laterality: Left

## 2020-05-28 MED ORDER — LIDOCAINE 2% (20 MG/ML) 5 ML SYRINGE
INTRAMUSCULAR | Status: AC
  Filled 2020-05-28: qty 5

## 2020-05-28 MED ORDER — MIDAZOLAM HCL 2 MG/2ML IJ SOLN
INTRAMUSCULAR | Status: DC | PRN
  Administered 2020-05-28: 2 mg via INTRAVENOUS

## 2020-05-28 MED ORDER — ONDANSETRON HCL 4 MG/2ML IJ SOLN
INTRAMUSCULAR | Status: AC
  Filled 2020-05-28: qty 2

## 2020-05-28 MED ORDER — PROPOFOL 10 MG/ML IV BOLUS
INTRAVENOUS | Status: AC
  Filled 2020-05-28: qty 20

## 2020-05-28 MED ORDER — LIDOCAINE-EPINEPHRINE 1 %-1:100000 IJ SOLN
INTRAMUSCULAR | Status: AC
  Filled 2020-05-28: qty 1

## 2020-05-28 MED ORDER — AMISULPRIDE (ANTIEMETIC) 5 MG/2ML IV SOLN
INTRAVENOUS | Status: AC
  Administered 2020-05-28: 10 mg via INTRAVENOUS
  Filled 2020-05-28: qty 4

## 2020-05-28 MED ORDER — SUGAMMADEX SODIUM 200 MG/2ML IV SOLN
INTRAVENOUS | Status: DC | PRN
  Administered 2020-05-28: 200 mg via INTRAVENOUS

## 2020-05-28 MED ORDER — FENTANYL CITRATE (PF) 250 MCG/5ML IJ SOLN
INTRAMUSCULAR | Status: AC
  Filled 2020-05-28: qty 5

## 2020-05-28 MED ORDER — ONDANSETRON HCL 4 MG/2ML IJ SOLN
4.0000 mg | INTRAMUSCULAR | Status: DC | PRN
  Administered 2020-05-28: 4 mg via INTRAVENOUS
  Filled 2020-05-28: qty 2

## 2020-05-28 MED ORDER — OXYCODONE HCL 5 MG/5ML PO SOLN: 5.0000 mg | Freq: Once | ORAL | Status: DC | PRN

## 2020-05-28 MED ORDER — ONDANSETRON HCL 4 MG PO TABS: 4.0000 mg | ORAL_TABLET | ORAL | Status: DC | PRN

## 2020-05-28 MED ORDER — MIDAZOLAM HCL 2 MG/2ML IJ SOLN
INTRAMUSCULAR | Status: AC
  Filled 2020-05-28: qty 2

## 2020-05-28 MED ORDER — ROCURONIUM BROMIDE 10 MG/ML (PF) SYRINGE
PREFILLED_SYRINGE | INTRAVENOUS | Status: DC | PRN
  Administered 2020-05-28: 50 mg via INTRAVENOUS

## 2020-05-28 MED ORDER — HYDROCODONE-ACETAMINOPHEN 5-325 MG PO TABS
1.0000 | ORAL_TABLET | ORAL | Status: DC | PRN
  Administered 2020-05-28: 1 via ORAL
  Filled 2020-05-28: qty 1

## 2020-05-28 MED ORDER — DEXAMETHASONE SODIUM PHOSPHATE 10 MG/ML IJ SOLN
INTRAMUSCULAR | Status: DC | PRN
  Administered 2020-05-28: 10 mg via INTRAVENOUS

## 2020-05-28 MED ORDER — FENTANYL CITRATE (PF) 100 MCG/2ML IJ SOLN
25.0000 ug | INTRAMUSCULAR | Status: DC | PRN
  Administered 2020-05-28: 25 ug via INTRAVENOUS
  Administered 2020-05-28: 50 ug via INTRAVENOUS

## 2020-05-28 MED ORDER — 0.9 % SODIUM CHLORIDE (POUR BTL) OPTIME
TOPICAL | Status: DC | PRN
  Administered 2020-05-28: 1000 mL

## 2020-05-28 MED ORDER — STERILE WATER FOR IRRIGATION IR SOLN
Status: DC | PRN
  Administered 2020-05-28: 1000 mL

## 2020-05-28 MED ORDER — ONDANSETRON HCL 4 MG/2ML IJ SOLN
INTRAMUSCULAR | Status: DC | PRN
  Administered 2020-05-28: 4 mg via INTRAVENOUS

## 2020-05-28 MED ORDER — PROMETHAZINE HCL 25 MG/ML IJ SOLN: 6.2500 mg | INTRAMUSCULAR | Status: DC | PRN

## 2020-05-28 MED ORDER — LACTATED RINGERS IV SOLN: INTRAVENOUS | Status: DC

## 2020-05-28 MED ORDER — FENTANYL CITRATE (PF) 100 MCG/2ML IJ SOLN
INTRAMUSCULAR | Status: AC
  Filled 2020-05-28: qty 2

## 2020-05-28 MED ORDER — DEXTROSE-NACL 5-0.9 % IV SOLN: INTRAVENOUS | Status: DC

## 2020-05-28 MED ORDER — LIDOCAINE 2% (20 MG/ML) 5 ML SYRINGE
INTRAMUSCULAR | Status: DC | PRN
  Administered 2020-05-28: 60 mg via INTRAVENOUS

## 2020-05-28 MED ORDER — OXYCODONE HCL 5 MG PO TABS
5.0000 mg | ORAL_TABLET | Freq: Once | ORAL | Status: DC | PRN
Start: 2020-05-28 — End: 2020-05-28

## 2020-05-28 MED ORDER — CHLORHEXIDINE GLUCONATE 0.12 % MT SOLN
15.0000 mL | Freq: Once | OROMUCOSAL | Status: AC
  Administered 2020-05-28: 15 mL via OROMUCOSAL
  Filled 2020-05-28: qty 15

## 2020-05-28 MED ORDER — ROCURONIUM BROMIDE 10 MG/ML (PF) SYRINGE
PREFILLED_SYRINGE | INTRAVENOUS | Status: AC
  Filled 2020-05-28: qty 10

## 2020-05-28 MED ORDER — FENTANYL CITRATE (PF) 250 MCG/5ML IJ SOLN
INTRAMUSCULAR | Status: DC | PRN
  Administered 2020-05-28 (×2): 50 ug via INTRAVENOUS
  Administered 2020-05-28: 100 ug via INTRAVENOUS
  Administered 2020-05-28: 50 ug via INTRAVENOUS

## 2020-05-28 MED ORDER — PROPOFOL 10 MG/ML IV BOLUS
INTRAVENOUS | Status: DC | PRN
  Administered 2020-05-28: 150 mg via INTRAVENOUS

## 2020-05-28 MED ORDER — IBUPROFEN 100 MG/5ML PO SUSP
400.0000 mg | Freq: Four times a day (QID) | ORAL | Status: DC | PRN
  Administered 2020-05-28: 400 mg via ORAL
  Filled 2020-05-28: qty 20

## 2020-05-28 MED ORDER — AMISULPRIDE (ANTIEMETIC) 5 MG/2ML IV SOLN: 10.0000 mg | Freq: Once | INTRAVENOUS | Status: AC

## 2020-05-28 MED ORDER — ORAL CARE MOUTH RINSE: 15.0000 mL | Freq: Once | OROMUCOSAL | Status: AC

## 2020-05-28 MED ORDER — DEXAMETHASONE SODIUM PHOSPHATE 10 MG/ML IJ SOLN
INTRAMUSCULAR | Status: AC
  Filled 2020-05-28: qty 1

## 2020-05-28 MED ORDER — BACITRACIN ZINC 500 UNIT/GM EX OINT
TOPICAL_OINTMENT | CUTANEOUS | Status: AC
  Filled 2020-05-28: qty 28.35

## 2020-05-28 MED ORDER — ACETAMINOPHEN 10 MG/ML IV SOLN: 1000.0000 mg | Freq: Once | INTRAVENOUS | Status: DC | PRN

## 2020-05-28 SURGICAL SUPPLY — 40 items
BLADE SURG 15 STRL LF DISP TIS (BLADE) IMPLANT
BLADE SURG 15 STRL SS (BLADE)
CANISTER SUCT 3000ML PPV (MISCELLANEOUS) ×2 IMPLANT
CLEANER TIP ELECTROSURG 2X2 (MISCELLANEOUS) ×2 IMPLANT
CNTNR URN SCR LID CUP LEK RST (MISCELLANEOUS) ×1 IMPLANT
CONT SPEC 4OZ STRL OR WHT (MISCELLANEOUS) ×1
CORD BIPOLAR FORCEPS 12FT (ELECTRODE) ×2 IMPLANT
COVER SURGICAL LIGHT HANDLE (MISCELLANEOUS) ×2 IMPLANT
COVER WAND RF STERILE (DRAPES) ×2 IMPLANT
DERMABOND ADVANCED (GAUZE/BANDAGES/DRESSINGS) ×1
DERMABOND ADVANCED .7 DNX12 (GAUZE/BANDAGES/DRESSINGS) ×1 IMPLANT
DRAIN HEMOVAC 7FR (DRAIN) IMPLANT
DRAIN JP 10F RND RADIO (DRAIN) ×2 IMPLANT
DRAIN SNY 10 ROU (WOUND CARE) IMPLANT
DRAPE HALF SHEET 40X57 (DRAPES) IMPLANT
ELECT COATED BLADE 2.86 ST (ELECTRODE) ×2 IMPLANT
ELECT REM PT RETURN 9FT ADLT (ELECTROSURGICAL) ×2
ELECTRODE REM PT RTRN 9FT ADLT (ELECTROSURGICAL) ×1 IMPLANT
EVACUATOR SILICONE 100CC (DRAIN) ×4 IMPLANT
FORCEPS BIPOLAR SPETZLER 8 1.0 (NEUROSURGERY SUPPLIES) ×2 IMPLANT
GAUZE 4X4 16PLY RFD (DISPOSABLE) IMPLANT
GLOVE BIO SURGEON STRL SZ 6.5 (GLOVE) IMPLANT
GLOVE ECLIPSE 7.5 STRL STRAW (GLOVE) ×2 IMPLANT
GOWN STRL REUS W/ TWL LRG LVL3 (GOWN DISPOSABLE) ×2 IMPLANT
GOWN STRL REUS W/TWL LRG LVL3 (GOWN DISPOSABLE) ×2
KIT BASIN OR (CUSTOM PROCEDURE TRAY) ×2 IMPLANT
KIT TURNOVER KIT B (KITS) ×2 IMPLANT
NEEDLE PRECISIONGLIDE 27X1.5 (NEEDLE) ×2 IMPLANT
NS IRRIG 1000ML POUR BTL (IV SOLUTION) ×2 IMPLANT
PAD ARMBOARD 7.5X6 YLW CONV (MISCELLANEOUS) ×4 IMPLANT
PENCIL FOOT CONTROL (ELECTRODE) ×2 IMPLANT
SHEARS HARMONIC 9CM CVD (BLADE) ×2 IMPLANT
STAPLER VISISTAT 35W (STAPLE) IMPLANT
SUT CHROMIC 3 0 PS 2 (SUTURE) ×2 IMPLANT
SUT CHROMIC 4 0 PS 2 18 (SUTURE) IMPLANT
SUT ETHILON 3 0 PS 1 (SUTURE) ×2 IMPLANT
SUT SILK 3 0 REEL (SUTURE) ×2 IMPLANT
SUT SILK 4 0 REEL (SUTURE) IMPLANT
TOWEL GREEN STERILE FF (TOWEL DISPOSABLE) ×2 IMPLANT
TRAY ENT MC OR (CUSTOM PROCEDURE TRAY) ×2 IMPLANT

## 2020-05-28 NOTE — Anesthesia Postprocedure Evaluation (Signed)
Anesthesia Post Note  Patient: Erika Davenport  Procedure(s) Performed: THYROID LOBECTOMY WITH POSSIBLY THYROIDECTOMY (Left )     Patient location during evaluation: PACU Anesthesia Type: General Level of consciousness: awake and alert Pain management: pain level controlled Vital Signs Assessment: post-procedure vital signs reviewed and stable Respiratory status: spontaneous breathing, nonlabored ventilation, respiratory function stable and patient connected to nasal cannula oxygen Cardiovascular status: blood pressure returned to baseline and stable Postop Assessment: no apparent nausea or vomiting Anesthetic complications: no   No complications documented.  Last Vitals:  Vitals:   05/28/20 1300 05/28/20 1315  BP: 107/87 115/78  Pulse: 73 80  Resp: 14   Temp:    SpO2: 98% 98%    Last Pain:  Vitals:   05/28/20 1231  TempSrc:   PainSc: 3                  Trenda Corliss P Adean Milosevic

## 2020-05-28 NOTE — Op Note (Signed)
OPERATIVE REPORT  DATE OF SURGERY: 05/28/2020  PATIENT:  Erika Davenport,  40 y.o. female  PRE-OPERATIVE DIAGNOSIS:  Goiter  POST-OPERATIVE DIAGNOSIS:  Goiter  PROCEDURE:  Procedure(s): THYROID LOBECTOMY WITH POSSIBLY THYROIDECTOMY  SURGEON:  Susy Frizzle, MD  ASSISTANTS: RNFA  ANESTHESIA:   General   EBL: 20 ml  DRAINS: 10 French round JP  LOCAL MEDICATIONS USED:  None  SPECIMEN: Left thyroid lobe, frozen section negative for carcinoma  COUNTS:  Correct  PROCEDURE DETAILS: The patient was taken to the operating room and placed on the operating table in the supine position. A shoulder roll was placed beneath the shoulder blades and the neck was extended. The neck was prepped and draped in a standard fashion. A low collar transverse incision was outlined with a marking pen and was incised with electrocautery. Dissection was continued down through the platysma layer.  Self-retaining retractors were used throughout the case.  The midline fascia was divided.  The strap muscles were reflected off of the large left lobe cystic mass.  The right strap muscles were divided for about 1-1/2 cm to facilitate dissection.  The mass was identified in the left lobe was brought forward.  The superior vasculature was divided using the harmonic dissector.  Middle thyroid vein was treated in a similar fashion.  Inferior vasculature as well.  The dissection stayed right on the capsule of the gland.  The isthmus was divided.  Berry's ligament was divided.  The specimen was delivered and sent for pathologic evaluation.  The recurrent nerve was felt to be deep to the level of dissection and the remaining soft tissue.  A suspected superior parathyroid was identified and kept intact with its blood supply.  The right strap muscles were reapproximated with interrupted 4-0 chromic.  The midline strap diastases was reapproximated in a similar fashion.  The drain was secured in place with a nylon suture. The  midline fascia was reapproximated with interrupted chromic suture. The platysma layer was also reapproximated with interrupted chromic suture. A running subcuticular closure was accomplished. Dermabond was used on the skin. The drain was charged. The patient was awakened, extubated and transferred to recovery in stable condition.   PATIENT DISPOSITION:  To PACU, stable

## 2020-05-28 NOTE — Anesthesia Preprocedure Evaluation (Signed)
Anesthesia Evaluation  Patient identified by MRN, date of birth, ID band Patient awake    Reviewed: Allergy & Precautions, NPO status , Patient's Chart, lab work & pertinent test results  Airway Mallampati: II  TM Distance: >3 FB Neck ROM: Full   Comment: Palpable thyroid goiter without tracheal deviation Dental  (+) Teeth Intact   Pulmonary neg pulmonary ROS,    Pulmonary exam normal        Cardiovascular negative cardio ROS   Rhythm:Regular Rate:Normal     Neuro/Psych negative neurological ROS  negative psych ROS   GI/Hepatic negative GI ROS, Neg liver ROS,   Endo/Other  Thyroid goiter  Renal/GU negative Renal ROS  negative genitourinary   Musculoskeletal negative musculoskeletal ROS (+)   Abdominal (+)  Abdomen: soft. Bowel sounds: normal.  Peds  Hematology negative hematology ROS (+)   Anesthesia Other Findings   Reproductive/Obstetrics negative OB ROS                             Anesthesia Physical Anesthesia Plan  ASA: II  Anesthesia Plan: General   Post-op Pain Management:    Induction: Intravenous  PONV Risk Score and Plan: 3 and Ondansetron, Dexamethasone, Midazolam and Treatment may vary due to age or medical condition  Airway Management Planned: Mask and Oral ETT  Additional Equipment: None  Intra-op Plan:   Post-operative Plan: Extubation in OR  Informed Consent: I have reviewed the patients History and Physical, chart, labs and discussed the procedure including the risks, benefits and alternatives for the proposed anesthesia with the patient or authorized representative who has indicated his/her understanding and acceptance.     Dental advisory given  Plan Discussed with: CRNA  Anesthesia Plan Comments: (Lab Results      Component                Value               Date                      WBC                      5.7                 05/28/2020                 HGB                      12.7                05/28/2020                HCT                      38.5                05/28/2020                MCV                      91.4                05/28/2020                PLT  239                 05/28/2020           Lab Results      Component                Value               Date                      NA                       136                 05/09/2020                K                        3.8                 05/09/2020                CO2                      25                  05/09/2020                GLUCOSE                  101 (H)             05/09/2020                BUN                      8                   05/09/2020                CREATININE               0.74                05/09/2020                CALCIUM                  9.3                 05/09/2020                GFRNONAA                 >60                 05/09/2020          )        Anesthesia Quick Evaluation

## 2020-05-28 NOTE — Anesthesia Procedure Notes (Signed)
Procedure Name: Intubation Date/Time: 05/28/2020 10:22 AM Performed by: Shary Decamp, CRNA Pre-anesthesia Checklist: Patient identified, Patient being monitored, Timeout performed, Emergency Drugs available and Suction available Patient Re-evaluated:Patient Re-evaluated prior to induction Oxygen Delivery Method: Circle System Utilized Preoxygenation: Pre-oxygenation with 100% oxygen Induction Type: IV induction Ventilation: Mask ventilation without difficulty Laryngoscope Size: Miller and 2 Grade View: Grade I Tube type: Oral Tube size: 7.5 mm Number of attempts: 1 Airway Equipment and Method: Stylet Placement Confirmation: ETT inserted through vocal cords under direct vision,  positive ETCO2 and breath sounds checked- equal and bilateral Secured at: 21 cm Tube secured with: Tape Dental Injury: Teeth and Oropharynx as per pre-operative assessment

## 2020-05-28 NOTE — Discharge Instructions (Signed)
You may shower and use soap and water. Do not use any creams, oils or ointment. ° °

## 2020-05-28 NOTE — Progress Notes (Signed)
ENT Post Operative Note  Subjective: Patient seen and examined at bedside, resting comfortably. Endorses pain around surgical site, controlled with medication. She is tolerating PO without difficulty. She denies nausea/vomiting.   Vitals:   05/28/20 1300 05/28/20 1315  BP: 107/87 115/78  Pulse: 73 80  Resp: 14   Temp: 98 F (36.7 C)   SpO2: 98% 98%     OBJECTIVE  Gen: alert, cooperative, appropriate Head/ENT: EOMI,  mucus membranes moist and pink, conjunctiva clear Neck incision C/D/I with dermabond overlying, neck soft, with no evidence of hematoma or seroma. JP drain exiting inferiorly with 10 sanguinous drainage. Face moves symmetrically Respiratory: Voice without dysphonia. Non-labored breathing, no accessory muscle use, normal HR, good O2 saturations  ASSESS/ PLAN  Erika Davenport is a 40 y.o. female who is POD 0 from left hemithyroidectomy.  -Continue pain control withPRN Norco, Ibuprofen -Continue JP drain to bulb suction, empty and record output Q shift -MIVF- Can saline lock when tolerating adequate PO intake -Continue observsation  Please do not hesitate to contact me with any questions or concerns.   Laren Boom, DO Otolaryngology [facility] ENT Cell: [phone]

## 2020-05-28 NOTE — Interval H&P Note (Signed)
History and Physical Interval Note:  05/28/2020 9:35 AM  Erika Davenport  has presented today for surgery, with the diagnosis of Goiter.  The various methods of treatment have been discussed with the patient and family. After consideration of risks, benefits and other options for treatment, the patient has consented to  Procedure(s): THYROID LOBECTOMY WITH POSSIBLY THYROIDECTOMY (Left) as a surgical intervention.  The patient's history has been reviewed, patient examined, no change in status, stable for surgery.  I have reviewed the patient's chart and labs.  Questions were answered to the patient's satisfaction.     Serena Colonel

## 2020-05-28 NOTE — Plan of Care (Signed)
  Problem: Pain Managment: Goal: General experience of comfort will improve Outcome: Progressing  Blood pressure 115/78, pulse 80, temperature 98 F (36.7 C), resp. rate 14, height 5\' 5"  (1.651 m), weight 99.3 kg, last menstrual period 05/24/2020, SpO2 98 %.  A&O X4, Pain Nausea meds administered, Rest . Purposeful rounding.

## 2020-05-28 NOTE — Transfer of Care (Signed)
Immediate Anesthesia Transfer of Care Note  Patient: Erika Davenport  Procedure(s) Performed: THYROID LOBECTOMY WITH POSSIBLY THYROIDECTOMY (Left )  Patient Location: PACU  Anesthesia Type:General  Level of Consciousness: drowsy, patient cooperative and responds to stimulation  Airway & Oxygen Therapy: Patient Spontanous Breathing and Patient connected to nasal cannula oxygen  Post-op Assessment: Report given to RN, Post -op Vital signs reviewed and stable and Patient moving all extremities X 4  Post vital signs: Reviewed and stable  Last Vitals:  Vitals Value Taken Time  BP 127/115 05/28/20 1131  Temp    Pulse 86 05/28/20 1132  Resp 10 05/28/20 1132  SpO2 97 % 05/28/20 1132  Vitals shown include unvalidated device data.  Last Pain:  Vitals:   05/28/20 0757  TempSrc:   PainSc: 0-No pain         Complications: No complications documented.

## 2020-05-29 ENCOUNTER — Encounter (HOSPITAL_COMMUNITY): Payer: Self-pay | Admitting: Otolaryngology

## 2020-05-29 DIAGNOSIS — E041 Nontoxic single thyroid nodule: Secondary | ICD-10-CM | POA: Diagnosis not present

## 2020-05-29 LAB — SURGICAL PATHOLOGY

## 2020-05-29 MED ORDER — PROMETHAZINE HCL 25 MG RE SUPP: 25.0000 mg | Freq: Four times a day (QID) | RECTAL | 1 refills | Status: DC | PRN

## 2020-05-29 MED ORDER — HYDROCODONE-ACETAMINOPHEN 7.5-325 MG PO TABS: 1.0000 | ORAL_TABLET | Freq: Four times a day (QID) | ORAL | 0 refills | Status: DC | PRN

## 2020-05-29 NOTE — Progress Notes (Signed)
Patient given discharge instructions and stated understanding. Patient IV removed and waiting for her ride.

## 2020-05-29 NOTE — Plan of Care (Signed)
  Problem: Education: Goal: Knowledge of General Education information will improve Description: Including pain rating scale, medication(s)/side effects and non-pharmacologic comfort measures 05/29/2020 1033 by Coy Saunas, RN Outcome: Adequate for Discharge 05/29/2020 1033 by Coy Saunas, RN Outcome: Adequate for Discharge   Problem: Health Behavior/Discharge Planning: Goal: Ability to manage health-related needs will improve 05/29/2020 1033 by Coy Saunas, RN Outcome: Adequate for Discharge 05/29/2020 1033 by Coy Saunas, RN Outcome: Adequate for Discharge   Problem: Clinical Measurements: Goal: Ability to maintain clinical measurements within normal limits will improve 05/29/2020 1033 by Coy Saunas, RN Outcome: Adequate for Discharge 05/29/2020 1033 by Coy Saunas, RN Outcome: Adequate for Discharge Goal: Will remain free from infection 05/29/2020 1033 by Coy Saunas, RN Outcome: Adequate for Discharge 05/29/2020 1033 by Coy Saunas, RN Outcome: Adequate for Discharge Goal: Diagnostic test results will improve 05/29/2020 1033 by Coy Saunas, RN Outcome: Adequate for Discharge 05/29/2020 1033 by Coy Saunas, RN Outcome: Adequate for Discharge Goal: Respiratory complications will improve 05/29/2020 1033 by Coy Saunas, RN Outcome: Adequate for Discharge 05/29/2020 1033 by Coy Saunas, RN Outcome: Adequate for Discharge Goal: Cardiovascular complication will be avoided 05/29/2020 1033 by Coy Saunas, RN Outcome: Adequate for Discharge 05/29/2020 1033 by Coy Saunas, RN Outcome: Adequate for Discharge   Problem: Activity: Goal: Risk for activity intolerance will decrease 05/29/2020 1033 by Coy Saunas, RN Outcome: Adequate for Discharge 05/29/2020 1033 by Coy Saunas, RN Outcome: Adequate for Discharge   Problem: Nutrition: Goal: Adequate nutrition will be maintained 05/29/2020 1033 by Coy Saunas, RN Outcome: Adequate for  Discharge 05/29/2020 1033 by Coy Saunas, RN Outcome: Adequate for Discharge   Problem: Coping: Goal: Level of anxiety will decrease 05/29/2020 1033 by Coy Saunas, RN Outcome: Adequate for Discharge 05/29/2020 1033 by Coy Saunas, RN Outcome: Adequate for Discharge   Problem: Elimination: Goal: Will not experience complications related to bowel motility 05/29/2020 1033 by Coy Saunas, RN Outcome: Adequate for Discharge 05/29/2020 1033 by Coy Saunas, RN Outcome: Adequate for Discharge Goal: Will not experience complications related to urinary retention 05/29/2020 1033 by Coy Saunas, RN Outcome: Adequate for Discharge 05/29/2020 1033 by Coy Saunas, RN Outcome: Adequate for Discharge   Problem: Pain Managment: Goal: General experience of comfort will improve 05/29/2020 1033 by Coy Saunas, RN Outcome: Adequate for Discharge 05/29/2020 1033 by Coy Saunas, RN Outcome: Adequate for Discharge   Problem: Safety: Goal: Ability to remain free from injury will improve 05/29/2020 1033 by Coy Saunas, RN Outcome: Adequate for Discharge 05/29/2020 1033 by Coy Saunas, RN Outcome: Adequate for Discharge   Problem: Skin Integrity: Goal: Risk for impaired skin integrity will decrease 05/29/2020 1033 by Coy Saunas, RN Outcome: Adequate for Discharge 05/29/2020 1033 by Coy Saunas, RN Outcome: Adequate for Discharge

## 2020-05-29 NOTE — Plan of Care (Signed)

## 2020-05-29 NOTE — Discharge Summary (Signed)
Physician Discharge Summary  Patient ID: Erika Davenport MRN: [id number] DOB/AGE: 1980/07/06 40 y.o.  Admit date: 05/28/2020 Discharge date: 05/29/2020  Admission Diagnoses: Thyroid mass  Discharge Diagnoses:  Active Problems:   S/P partial thyroidectomy   Discharged Condition: good  Hospital Course: No complications  Consults: none  Significant Diagnostic Studies: none  Treatments: surgery: Left thyroid lobectomy  Discharge Exam: Blood pressure 117/68, pulse 68, temperature 98.3 F (36.8 C), temperature source Oral, resp. rate 18, height 5\' 5"  (1.651 m), weight 99.3 kg, last menstrual period 05/24/2020, SpO2 99 %. PHYSICAL EXAM: Awake and alert, breathing and voice are normal.  Incision looks excellent.  JP drain removed.  Disposition:    Allergies as of 05/29/2020      Reactions   Pertussis Vaccines Hives      Medication List    TAKE these medications   HYDROcodone-acetaminophen 7.5-325 MG tablet Commonly known as: Norco Take 1 tablet by mouth every 6 (six) hours as needed for moderate pain.   promethazine 25 MG suppository Commonly known as: PHENERGAN Place 1 suppository (25 mg total) rectally every 6 (six) hours as needed for nausea or vomiting.        Signed: 06-08-1976 05/29/2020, 8:47 AM

## 2020-05-29 NOTE — Plan of Care (Signed)
  Problem: Education: Goal: Knowledge of General Education information will improve Description: Including pain rating scale, medication(s)/side effects and non-pharmacologic comfort measures 05/29/2020 1033 by Mishel Sans A, RN Outcome: Adequate for Discharge 05/29/2020 1033 by Faithlynn Deeley A, RN Outcome: Adequate for Discharge   Problem: Health Behavior/Discharge Planning: Goal: Ability to manage health-related needs will improve 05/29/2020 1033 by Kenson Groh A, RN Outcome: Adequate for Discharge 05/29/2020 1033 by Gurjot Brisco A, RN Outcome: Adequate for Discharge   Problem: Clinical Measurements: Goal: Ability to maintain clinical measurements within normal limits will improve 05/29/2020 1033 by Verlena Marlette A, RN Outcome: Adequate for Discharge 05/29/2020 1033 by Adarsh Mundorf A, RN Outcome: Adequate for Discharge Goal: Will remain free from infection 05/29/2020 1033 by Wei Newbrough A, RN Outcome: Adequate for Discharge 05/29/2020 1033 by Laurelin Elson A, RN Outcome: Adequate for Discharge Goal: Diagnostic test results will improve 05/29/2020 1033 by Makynzie Dobesh A, RN Outcome: Adequate for Discharge 05/29/2020 1033 by Oriyah Lamphear A, RN Outcome: Adequate for Discharge Goal: Respiratory complications will improve 05/29/2020 1033 by Aasiya Creasey A, RN Outcome: Adequate for Discharge 05/29/2020 1033 by Marcquis Ridlon A, RN Outcome: Adequate for Discharge Goal: Cardiovascular complication will be avoided 05/29/2020 1033 by Samyrah Bruster A, RN Outcome: Adequate for Discharge 05/29/2020 1033 by Shayn Madole A, RN Outcome: Adequate for Discharge   Problem: Activity: Goal: Risk for activity intolerance will decrease 05/29/2020 1033 by Aadarsh Cozort A, RN Outcome: Adequate for Discharge 05/29/2020 1033 by Satish Hammers A, RN Outcome: Adequate for Discharge   Problem: Nutrition: Goal: Adequate nutrition will be maintained 05/29/2020 1033 by Yurani Fettes A, RN Outcome: Adequate for  Discharge 05/29/2020 1033 by Jarrel Knoke A, RN Outcome: Adequate for Discharge   Problem: Coping: Goal: Level of anxiety will decrease 05/29/2020 1033 by Jesse Hirst A, RN Outcome: Adequate for Discharge 05/29/2020 1033 by Cielo Arias A, RN Outcome: Adequate for Discharge   Problem: Elimination: Goal: Will not experience complications related to bowel motility 05/29/2020 1033 by Lolamae Voisin A, RN Outcome: Adequate for Discharge 05/29/2020 1033 by Juanita Streight A, RN Outcome: Adequate for Discharge Goal: Will not experience complications related to urinary retention 05/29/2020 1033 by Dailin Sosnowski A, RN Outcome: Adequate for Discharge 05/29/2020 1033 by Khiree Bukhari A, RN Outcome: Adequate for Discharge   Problem: Pain Managment: Goal: General experience of comfort will improve 05/29/2020 1033 by Abreanna Drawdy A, RN Outcome: Adequate for Discharge 05/29/2020 1033 by Percy Comp A, RN Outcome: Adequate for Discharge   Problem: Safety: Goal: Ability to remain free from injury will improve 05/29/2020 1033 by Therasa Lorenzi A, RN Outcome: Adequate for Discharge 05/29/2020 1033 by Marquett Bertoli A, RN Outcome: Adequate for Discharge   Problem: Skin Integrity: Goal: Risk for impaired skin integrity will decrease 05/29/2020 1033 by Story Conti A, RN Outcome: Adequate for Discharge 05/29/2020 1033 by Nabila Albarracin A, RN Outcome: Adequate for Discharge   

## 2021-05-14 ENCOUNTER — Ambulatory Visit
Admission: RE | Admit: 2021-05-14 | Discharge: 2021-05-14 | Disposition: A | Discharge status: Home / Self Care | Payer: Medicaid Other | Source: Ambulatory Visit | Attending: Otolaryngology | Admitting: Otolaryngology

## 2021-05-14 ENCOUNTER — Other Ambulatory Visit: Payer: Self-pay | Admitting: Otolaryngology

## 2021-05-14 DIAGNOSIS — E049 Nontoxic goiter, unspecified: Secondary | ICD-10-CM

## 2021-07-19 ENCOUNTER — Ambulatory Visit
Admission: RE | Admit: 2021-07-19 | Discharge: 2021-07-19 | Disposition: A | Discharge status: Home / Self Care | Payer: Medicaid Other | Source: Ambulatory Visit

## 2021-07-19 VITALS — BP 97/70 | HR 70 | Temp 98.1°F | Resp 18

## 2021-07-19 DIAGNOSIS — M5030 Other cervical disc degeneration, unspecified cervical region: Secondary | ICD-10-CM | POA: Diagnosis not present

## 2021-07-19 DIAGNOSIS — M546 Pain in thoracic spine: Secondary | ICD-10-CM | POA: Diagnosis not present

## 2021-07-19 DIAGNOSIS — M5136 Other intervertebral disc degeneration, lumbar region: Secondary | ICD-10-CM

## 2021-07-19 DIAGNOSIS — M51369 Other intervertebral disc degeneration, lumbar region without mention of lumbar back pain or lower extremity pain: Secondary | ICD-10-CM

## 2021-07-19 DIAGNOSIS — M545 Low back pain, unspecified: Secondary | ICD-10-CM

## 2021-07-19 DIAGNOSIS — M419 Scoliosis, unspecified: Secondary | ICD-10-CM

## 2021-07-19 MED ORDER — TIZANIDINE HCL 4 MG PO TABS: 4.0000 mg | ORAL_TABLET | Freq: Every day | ORAL | 0 refills | Status: AC

## 2021-07-19 MED ORDER — TRIAMCINOLONE ACETONIDE 40 MG/ML IJ SUSP
40.0000 mg | Freq: Once | INTRAMUSCULAR | Status: AC
  Administered 2021-07-19: 40 mg via INTRAMUSCULAR

## 2021-07-19 NOTE — ED Triage Notes (Signed)
Pt c/o mid/lower back pain that is progressively getting worse.  Started: Friday  Home interventions: Mobic (0500), robaxin (0700)- she states these meds are not working

## 2021-07-19 NOTE — ED Provider Notes (Signed)
[place]   MRN: [id number] DOB: 04-Apr-1980  Subjective:   Erika Davenport is a 41 y.o. female presenting for 2-day history of acute onset severe worsening mid to low back pain.  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  She also has mild dextroscoliosis of the thoracic spine.  All this was seen from MRIs done in 2021.  Has previously responded well to NSAIDs and a muscle relaxant.  She is trying this now but is not helping at all.  No fall, trauma, numbness or tingling, saddle paresthesia, changes to bowel or urinary habits, radicular symptoms.  No current facility-administered medications for this encounter.  Current Outpatient Medications:    HYDROcodone-acetaminophen (NORCO) 7.5-325 MG tablet, Take 1 tablet by mouth every 6 (six) hours as needed for moderate pain., Disp: 20 tablet, Rfl: 0   promethazine (PHENERGAN) 25 MG suppository, Place 1 suppository (25 mg total) rectally every 6 (six) hours as needed for nausea or vomiting., Disp: 12 suppository, Rfl: 1   Allergies  Allergen Reactions   Pertussis Vaccines Hives    No past medical history on file.   Past Surgical History:  Procedure Laterality Date   APPENDECTOMY     THYROIDECTOMY Left 05/28/2020   Procedure: THYROID LOBECTOMY WITH POSSIBLY THYROIDECTOMY;  Surgeon: Izora Gala, MD;  Location: [facility];  Service: ENT;  Laterality: Left;    No family history on file.  Social History   Tobacco Use   Smoking status: Never   Smokeless tobacco: Never  Vaping Use   Vaping Use: Never used  Substance Use Topics   Alcohol use: Not Currently   Drug use: Never    ROS   Objective:   Vitals: BP 97/70 (BP Location: Left Arm)   Pulse 70   Temp 98.1 F (36.7 C) (Oral)   Resp 18   SpO2 98%   Physical Exam Constitutional:      General: She is not in acute distress.    Appearance: Normal appearance. She is well-developed. She is not ill-appearing, toxic-appearing or  diaphoretic.  HENT:     Head: Normocephalic and atraumatic.     Nose: Nose normal.     Mouth/Throat:     Mouth: Mucous membranes are moist.  Eyes:     General: No scleral icterus.       Right eye: No discharge.        Left eye: No discharge.     Extraocular Movements: Extraocular movements intact.  Cardiovascular:     Rate and Rhythm: Normal rate.  Pulmonary:     Effort: Pulmonary effort is normal.  Musculoskeletal:     Comments: Full range of motion throughout.  Strength 5/5 for lower extremities.  Patient ambulates without any assistance and is favoring low back.  No ecchymosis, swelling, lacerations or abrasions.  Patient does have paraspinal muscle tenderness along the thoracic and lumbar regions of her back excluding the midline.    Skin:    General: Skin is warm and dry.  Neurological:     General: No focal deficit present.     Mental Status: She is alert and oriented to person, place, and time.     Motor: No weakness.     Coordination: Coordination abnormal.     Gait: Gait abnormal.  Psychiatric:        Mood and Affect: Mood normal.        Behavior: Behavior normal.    IM triamcinolone acetonide at 40  mg injected.  Assessment and Plan :   PDMP not reviewed this encounter.  1. Acute bilateral thoracic back pain   2. Bulging lumbar disc   3. Bulging of cervical intervertebral disc   4. Acute bilateral low back pain without sciatica   5. Scoliosis of thoracic spine, unspecified scoliosis type    As she is not responding to her typical regimen, recommended steroid injection.  Discussed back care.  She is to follow-up with the spine specialty clinic if symptoms persist. Counseled patient on potential for adverse effects with medications prescribed/recommended today, ER and return-to-clinic precautions discussed, patient verbalized understanding.    Jaynee Eagles, [place] 07/19/21 [phone]

## 2022-05-19 IMAGING — US US THYROID
1 series · 12 of 25 positions shown · non-contrast
Comparison: Neck CT-05/09/2020

CLINICAL DATA: Incidental on CT. Choking sensation. Large thyroid
nodule demonstrated on preceding neck CT

EXAM:
THYROID ULTRASOUND
TECHNIQUE: Ultrasound examination of the thyroid gland and adjacent soft
tissues was performed.

[Series 1: us thyroid · 0.08mm/px · 12 of 76 slices shown]
[im 4/76]
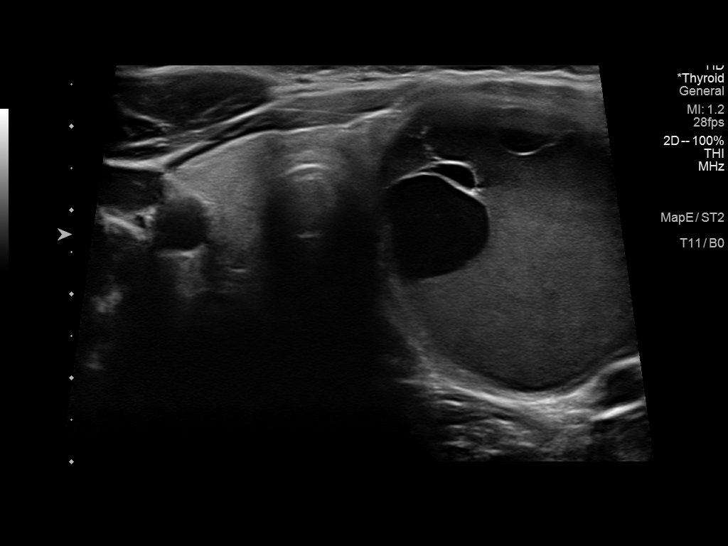
[im 10/76]
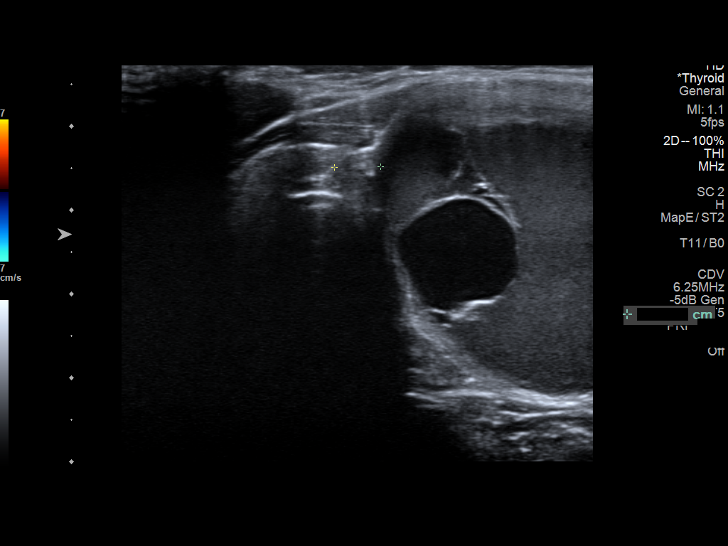
[im 16/76]
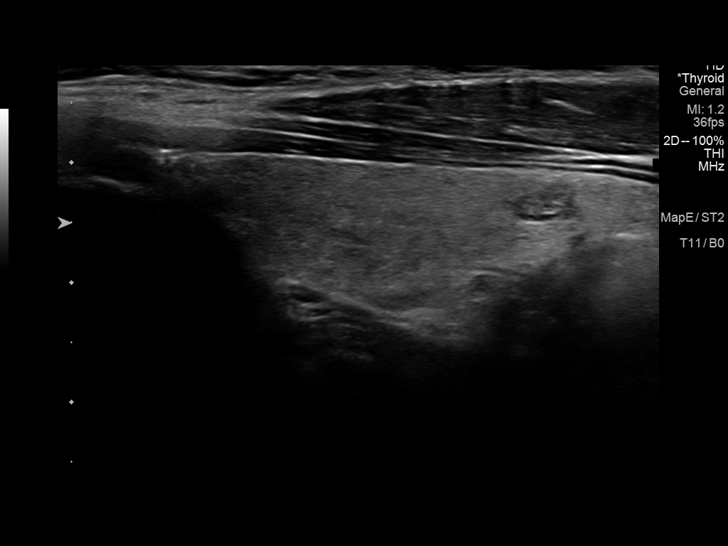
[im 22/76]
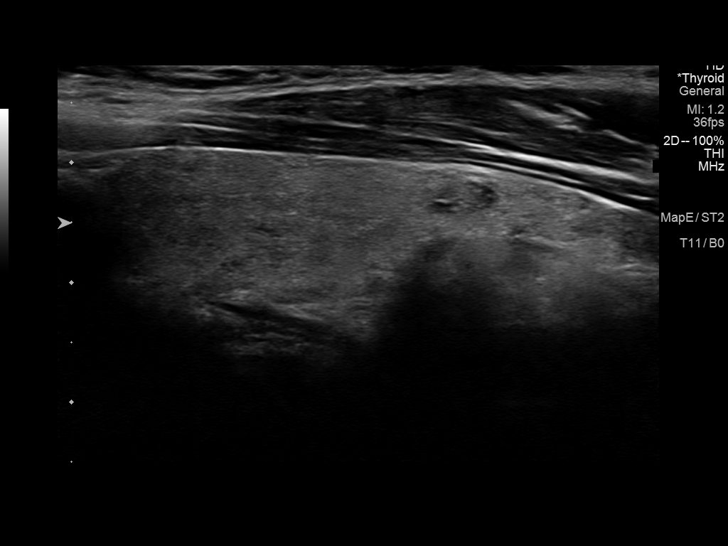
[im 29/76]
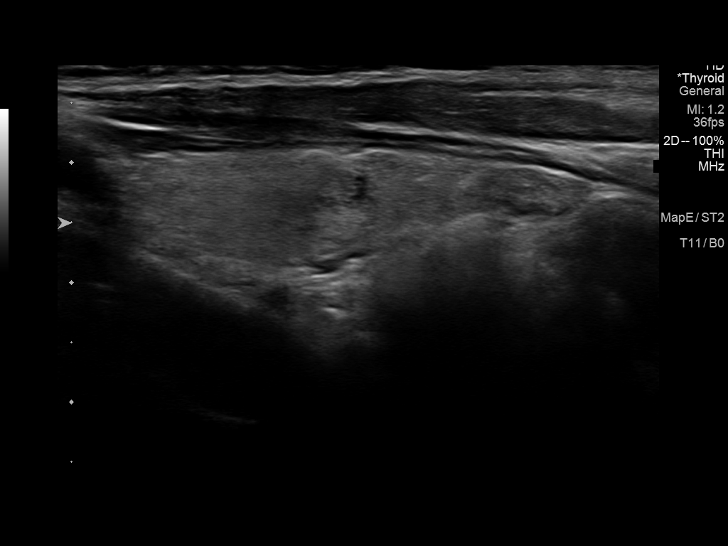
[im 35/76]
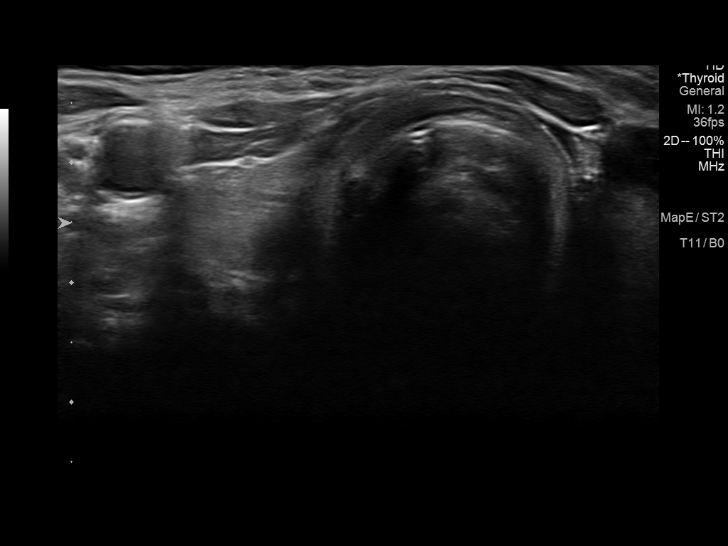
[im 41/76]
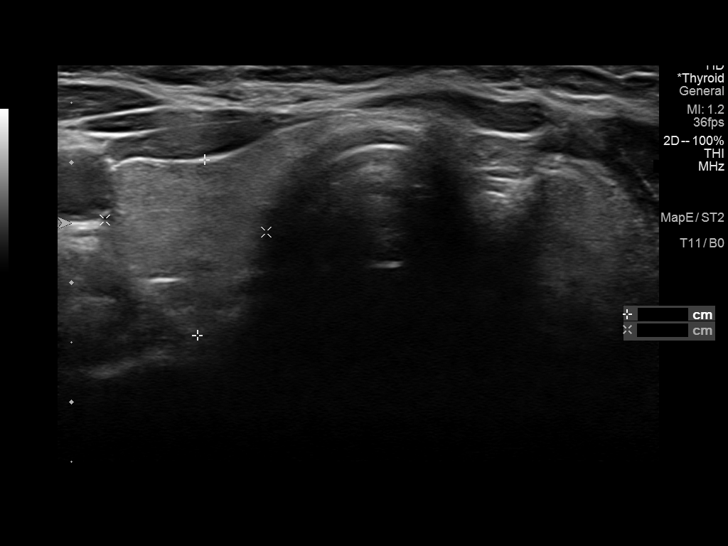
[im 47/76]
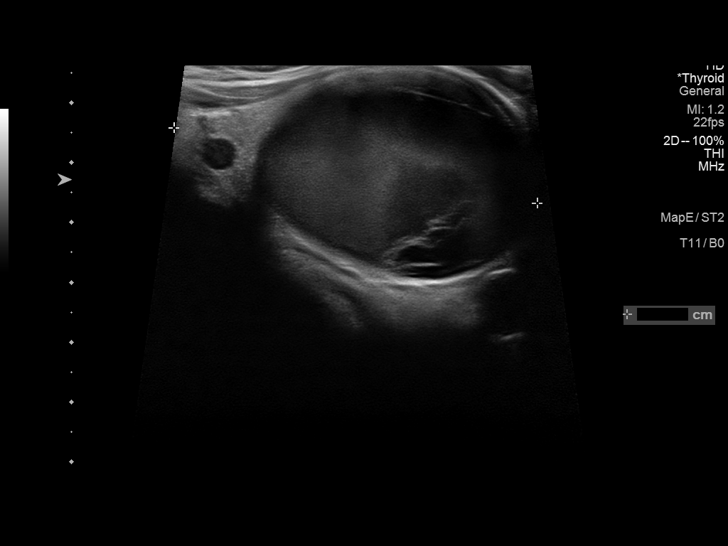
[im 54/76]
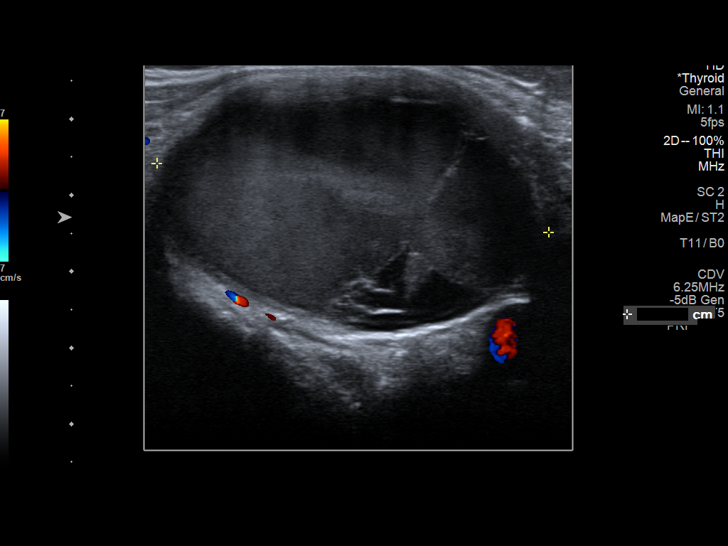
[im 60/76]
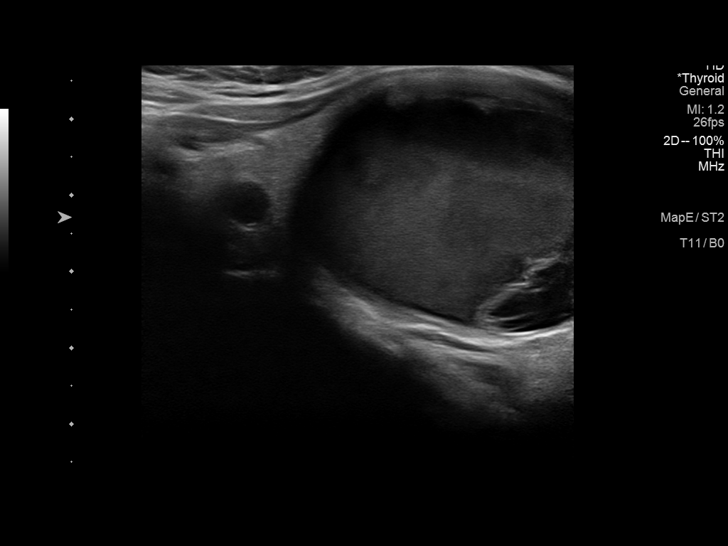
[im 66/76]
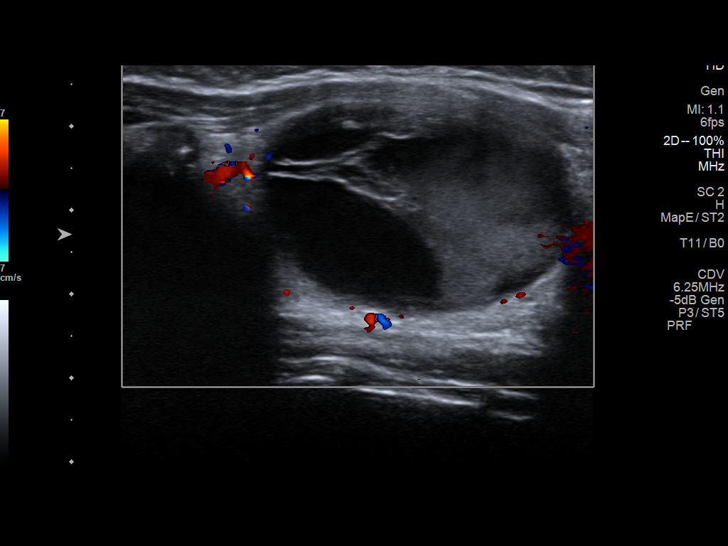
[im 72/76]
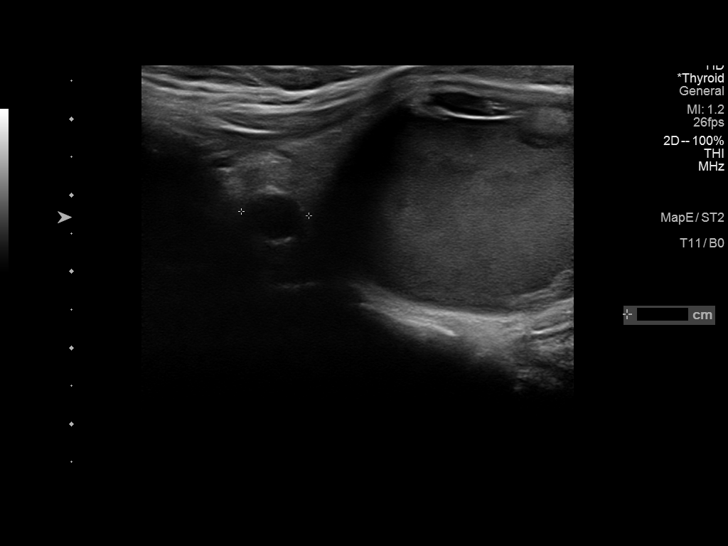

[12 of 25 positions shown; findings below may reference images not displayed]

FINDINGS: Parenchymal Echotexture: Mildly heterogenous

Isthmus: Normal in size measuring 0.3 cm in diameter

Right lobe: Normal in size measuring 4.8 x 1.5 x 1.4 cm

Left lobe: Enlarged measuring 6.2 x 3.9 x 5.1 cm

_________________________________________________________

Estimated total number of nodules >/= 1 cm: 1

Number of spongiform nodules >/=  2 cm not described below (TR1): 0

Number of mixed cystic and solid nodules >/= 1.5 cm not described
below (TR2): 0

_________________________________________________________

Questioned 0.6 cm isoechoic nodule within the left side of the
thyroid isthmus (labeled 1) is favored to represent a pseudonodule
as it lacks defined borders on both provided transverse and sagittal
images.

_________________________________________________________

Question 0.6 cm isoechoic nodule adjacent to the mid, posterior
aspect the right lobe of the thyroid (labeled 2), is favored to
represent a pseudonodule as it lacks defined borders on both
provided transverse and sagittal images.

The approximately 0.7 x 0.4 x 0.4 cm partially cystic though
predominantly solid isoechoic nodule within the inferior pole the
right lobe of the thyroid (labeled 3), does not meet criteria to
recommend percutaneous sampling or continued dedicated follow-up.

_________________________________________________________

Nodule # 4:

Location: Left; Mid correlates with nodule seen on preceding neck CT

Maximum size: 5.2 cm; Other 2 dimensions: 4.4 x 3.5 cm

Composition: cystic/almost completely cystic (0)

Echogenicity: hypoechoic (2)

Shape: not taller-than-wide (0)

Margins: smooth (0)

Echogenic foci: none (0)

ACR TI-RADS total points: 2.

ACR TI-RADS risk category: TR2 (2 points).

ACR TI-RADS recommendations:

This nodule does NOT meet TI-RADS criteria for biopsy or dedicated
follow-up.

_________________________________________________________

There is a punctate (approximately 0.8 cm) anechoic cyst within the
superior pole the left lobe of the thyroid (labeled 5), which does
not meet criteria to recommend percutaneous sampling or continued
dedicated follow-up.
IMPRESSION: 1. Findings suggestive of multinodular goiter.
2. Dominant minimally complex left-sided thyroid cyst (labeled 4),
correlating with the nodule seen on preceding neck CT, does not meet
criteria to recommend percutaneous sampling however could undergo
ultrasound-guided aspiration for therapeutic purposes as indicated.
3. None of the remaining nodules/pseudo nodules or cysts meet
imaging criteria to recommend percutaneous sampling or continued
dedicated follow-up.

The above is in keeping with the ACR TI-RADS recommendations - [HOSPITAL] 9863;[DATE].

## 2022-07-01 ENCOUNTER — Other Ambulatory Visit: Payer: Self-pay | Admitting: Neurosurgery

## 2022-07-01 DIAGNOSIS — M549 Dorsalgia, unspecified: Secondary | ICD-10-CM

## 2022-07-17 ENCOUNTER — Ambulatory Visit
Admission: RE | Admit: 2022-07-17 | Discharge: 2022-07-17 | Disposition: A | Discharge status: Home / Self Care | Payer: Medicaid Other | Source: Ambulatory Visit | Attending: Neurosurgery | Admitting: Neurosurgery

## 2022-07-17 DIAGNOSIS — M549 Dorsalgia, unspecified: Secondary | ICD-10-CM

## 2022-07-24 ENCOUNTER — Other Ambulatory Visit: Payer: Medicaid Other

## 2023-05-25 ENCOUNTER — Ambulatory Visit
Admission: RE | Admit: 2023-05-25 | Discharge: 2023-05-25 | Disposition: A | Discharge status: Home / Self Care | Source: Ambulatory Visit | Attending: Family Medicine | Admitting: Family Medicine

## 2023-05-25 VITALS — BP 123/74 | HR 69 | Temp 97.6°F | Resp 17 | Ht 65.0 in | Wt 205.0 lb

## 2023-05-25 DIAGNOSIS — M51369 Other intervertebral disc degeneration, lumbar region without mention of lumbar back pain or lower extremity pain: Secondary | ICD-10-CM

## 2023-05-25 DIAGNOSIS — S39012A Strain of muscle, fascia and tendon of lower back, initial encounter: Secondary | ICD-10-CM

## 2023-05-25 MED ORDER — PREDNISONE 20 MG PO TABS: ORAL_TABLET | ORAL | 0 refills | Status: DC

## 2023-05-25 MED ORDER — CYCLOBENZAPRINE HCL 5 MG PO TABS: 5.0000 mg | ORAL_TABLET | Freq: Every evening | ORAL | 0 refills | Status: DC | PRN

## 2023-05-25 MED ORDER — KETOROLAC TROMETHAMINE 30 MG/ML IJ SOLN
30.0000 mg | Freq: Once | INTRAMUSCULAR | Status: AC
  Administered 2023-05-25: 30 mg via INTRAMUSCULAR

## 2023-05-25 NOTE — ED Triage Notes (Signed)
 Pt states that she has some back pain. X2 days  Pt denies any known injury.

## 2023-05-25 NOTE — ED Provider Notes (Signed)
 [facility]  Note:  This document was prepared using Conservation officer, historic buildings and may include unintentional dictation errors.  MRN: [id number] DOB: 12-03-1980  Subjective:   Erika Davenport is a 43 y.o. female presenting for 2-day history of acute onset persistent lower back pain with stiffness.  Patient has a history of a bulging disc and feels that this may have been aggravated.  The only thing that she can recall is that patient bent at the level of her waist to pick something off the ground and subsequently had a very difficult time with her back, getting back up.  No fall, trauma, numbness or tingling, saddle paresthesia, changes to bowel or urinary habits, radicular symptoms.   No current facility-administered medications for this encounter.  Current Outpatient Medications:    meloxicam (MOBIC) 15 MG tablet, Take 15 mg by mouth daily., Disp: , Rfl:    tiZANidine (ZANAFLEX) 4 MG tablet, Take 1 tablet (4 mg total) by mouth at bedtime., Disp: 30 tablet, Rfl: 0   HYDROcodone-acetaminophen (NORCO) 7.5-325 MG tablet, Take 1 tablet by mouth every 6 (six) hours as needed for moderate pain., Disp: 20 tablet, Rfl: 0   medroxyPROGESTERone (DEPO-PROVERA) 150 MG/ML injection, Depo-Provera 150 mg/mL intramuscular suspension  Inject 1 mL every 3 months by intramuscular route., Disp: , Rfl:    methocarbamol (ROBAXIN) 500 MG tablet, Take 500 mg by mouth 4 (four) times daily., Disp: , Rfl:    promethazine (PHENERGAN) 25 MG suppository, Place 1 suppository (25 mg total) rectally every 6 (six) hours as needed for nausea or vomiting., Disp: 12 suppository, Rfl: 1   Allergies  Allergen Reactions   Pertussis Vaccines Hives    History reviewed. No pertinent past medical history.   Past Surgical History:  Procedure Laterality Date   APPENDECTOMY     THYROIDECTOMY Left 05/28/2020   Procedure: THYROID LOBECTOMY WITH POSSIBLY THYROIDECTOMY;  Surgeon: Serena Colonel, MD;   Location: [facility] OR;  Service: ENT;  Laterality: Left;    History reviewed. No pertinent family history.  Social History   Tobacco Use   Smoking status: Never   Smokeless tobacco: Never  Vaping Use   Vaping status: Never Used  Substance Use Topics   Alcohol use: Not Currently   Drug use: Never    ROS   Objective:   Vitals: BP 123/74 (BP Location: Left Arm)   Pulse 69   Temp 97.6 F (36.4 C) (Oral)   Resp 17   Ht 5\' 5"  (1.651 m)   Wt 205 lb (93 kg)   LMP 05/18/2023   SpO2 98%   BMI 34.11 kg/m   Physical Exam Constitutional:      General: She is not in acute distress.    Appearance: Normal appearance. She is well-developed. She is not ill-appearing, toxic-appearing or diaphoretic.  HENT:     Head: Normocephalic and atraumatic.     Nose: Nose normal.     Mouth/Throat:     Mouth: Mucous membranes are moist.  Eyes:     General: No scleral icterus.       Right eye: No discharge.        Left eye: No discharge.     Extraocular Movements: Extraocular movements intact.  Cardiovascular:     Rate and Rhythm: Normal rate.  Pulmonary:     Effort: Pulmonary effort is normal.  Musculoskeletal:     Lumbar back: Spasms and tenderness present. No swelling, edema, deformity, signs of trauma, lacerations or bony tenderness.  Decreased range of motion. Negative right straight leg raise test and negative left straight leg raise test. No scoliosis.  Skin:    General: Skin is warm and dry.  Neurological:     General: No focal deficit present.     Mental Status: She is alert and oriented to person, place, and time.  Psychiatric:        Mood and Affect: Mood normal.        Behavior: Behavior normal.    IM Toradol 30 mg administered in clinic.  Assessment and Plan :   PDMP not reviewed this encounter.  1. Lumbar strain, initial encounter   2. Bulging lumbar disc    Will manage a prednisone course given her severe pain, history of a bulging lumbar disc.  Also recommended  muscle relaxant, rest and modification of physical activity.  Anticipatory guidance provided.  Counseled patient on potential for adverse effects with medications prescribed/recommended today, ER and return-to-clinic precautions discussed, patient verbalized understanding.    Adolph Hoop, [place] 05/25/23 1135

## 2023-07-20 ENCOUNTER — Other Ambulatory Visit: Payer: Self-pay | Admitting: Physician Assistant

## 2023-07-20 DIAGNOSIS — Z1231 Encounter for screening mammogram for malignant neoplasm of breast: Secondary | ICD-10-CM

## 2023-07-27 ENCOUNTER — Ambulatory Visit
Admission: RE | Admit: 2023-07-27 | Discharge: 2023-07-27 | Disposition: A | Discharge status: Home / Self Care | Source: Ambulatory Visit | Attending: Physician Assistant | Admitting: Physician Assistant

## 2023-07-27 DIAGNOSIS — Z1231 Encounter for screening mammogram for malignant neoplasm of breast: Secondary | ICD-10-CM

## 2023-08-01 ENCOUNTER — Other Ambulatory Visit: Payer: Self-pay | Admitting: Physician Assistant

## 2023-08-01 DIAGNOSIS — R928 Other abnormal and inconclusive findings on diagnostic imaging of breast: Secondary | ICD-10-CM

## 2023-10-05 ENCOUNTER — Ambulatory Visit (HOSPITAL_BASED_OUTPATIENT_CLINIC_OR_DEPARTMENT_OTHER)
Admission: EM | Admit: 2023-10-05 | Discharge: 2023-10-05 | Disposition: A | Discharge status: Home / Self Care | Attending: Family Medicine | Admitting: Family Medicine

## 2023-10-05 ENCOUNTER — Ambulatory Visit

## 2023-10-05 ENCOUNTER — Other Ambulatory Visit (HOSPITAL_BASED_OUTPATIENT_CLINIC_OR_DEPARTMENT_OTHER): Payer: Self-pay

## 2023-10-05 ENCOUNTER — Encounter (HOSPITAL_BASED_OUTPATIENT_CLINIC_OR_DEPARTMENT_OTHER): Payer: Self-pay

## 2023-10-05 DIAGNOSIS — Z7721 Contact with and (suspected) exposure to potentially hazardous body fluids: Secondary | ICD-10-CM | POA: Diagnosis not present

## 2023-10-05 DIAGNOSIS — R300 Dysuria: Secondary | ICD-10-CM | POA: Diagnosis not present

## 2023-10-05 DIAGNOSIS — R1031 Right lower quadrant pain: Secondary | ICD-10-CM | POA: Diagnosis not present

## 2023-10-05 DIAGNOSIS — R1032 Left lower quadrant pain: Secondary | ICD-10-CM | POA: Diagnosis not present

## 2023-10-05 DIAGNOSIS — R350 Frequency of micturition: Secondary | ICD-10-CM | POA: Diagnosis not present

## 2023-10-05 DIAGNOSIS — R102 Pelvic and perineal pain: Secondary | ICD-10-CM

## 2023-10-05 DIAGNOSIS — N760 Acute vaginitis: Secondary | ICD-10-CM

## 2023-10-05 LAB — POCT URINE DIPSTICK
Bilirubin, UA: NEGATIVE
Blood, UA: NEGATIVE
Glucose, UA: NEGATIVE mg/dL
Nitrite, UA: NEGATIVE
Protein Ur, POC: NEGATIVE mg/dL
Spec Grav, UA: >=1.03 — AB (ref 1.010–1.025)
Urobilinogen, UA: 0.2 U/dL
pH, UA: 5.5 (ref 5.0–8.0)

## 2023-10-05 MED ORDER — NITROFURANTOIN MONOHYD MACRO 100 MG PO CAPS
100.0000 mg | ORAL_CAPSULE | Freq: Two times a day (BID) | ORAL | 0 refills | Status: AC
  Filled 2023-10-05: qty 10, 5d supply, fill #0

## 2023-10-05 NOTE — ED Provider Notes (Signed)
 [facility]    CSN: [id number] Arrival date & time: 10/05/23  1052      History   Chief Complaint Chief Complaint  Patient presents with   Urinary Frequency    HPI Erika Davenport is a 43 y.o. female.   42 year old female with complaint of urinary frequency and suprapubic pain or pressure with some rare irritation or burning with urination.  Symptoms since 10/02/2023 or earlier.  She denies hematuria, lower back pain, fever, nausea, vomiting, constipation, diarrhea.  She does have a low level of vaginal discharge but no odor and the discharge is mostly clear or white like mucus.  She denies a vaginal itch.  She is sexually active and has not always used barrier protection.  She would like STI testing including blood work if possible.   Urinary Frequency Associated symptoms include abdominal pain. Pertinent negatives include no chest pain and no shortness of breath.    History reviewed. No pertinent past medical history.  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      Home Medications    Prior to Admission medications   Medication Sig Start Date End Date Taking? Authorizing Provider  nitrofurantoin , macrocrystal-monohydrate, (MACROBID ) 100 MG capsule Take 1 capsule (100 mg total) by mouth 2 (two) times daily. 10/05/23  Yes Ival Domino, FNP  medroxyPROGESTERone (DEPO-PROVERA) 150 MG/ML injection Depo-Provera 150 mg/mL intramuscular suspension  Inject 1 mL every 3 months by intramuscular route.    [provider]  meloxicam (MOBIC) 15 MG tablet Take 15 mg by mouth daily. 05/21/21   [provider]  methocarbamol  (ROBAXIN) 500 MG tablet Take 500 mg by mouth 4 (four) times daily.    [provider]  tiZANidine  (ZANAFLEX ) 4 MG tablet Take 1 tablet (4 mg total) by mouth at bedtime. 07/19/21   Christopher Savannah, PA-C    Family History History reviewed. No pertinent family history.  Social History Social History   Tobacco Use   Smoking status: Never   Smokeless tobacco: Never  Vaping Use   Vaping status: Never Used  Substance Use Topics   Alcohol use: Not Currently   Drug use: Never     Allergies   Pertussis vaccines   Review of Systems Review of Systems  Constitutional:  Negative for chills and fever.  HENT:  Negative for ear pain and sore throat.   Eyes:  Negative for pain and visual disturbance.  Respiratory:  Negative for cough and shortness of breath.   Cardiovascular:  Negative for chest pain and palpitations.  Gastrointestinal:  Positive for abdominal pain. Negative for constipation, diarrhea, nausea and vomiting.  Genitourinary:  Positive for dysuria, frequency and vaginal discharge. Negative for hematuria, urgency, vaginal bleeding and vaginal pain (No pain and no itch but she has a feeling of vaginal irritation at times.).  Musculoskeletal:  Negative for arthralgias and back pain.  Skin:  Negative for color change and rash.  Neurological:  Negative for seizures and syncope.  All other systems reviewed and are negative.    Physical Exam Triage Vital Signs ED Triage Vitals  Encounter Vitals Group     BP 10/05/23  1103 120/78     Girls Systolic BP Percentile --      Girls Diastolic BP Percentile --      Boys Systolic BP Percentile --      Boys Diastolic BP Percentile --      Pulse Rate 10/05/23 1103 76     Resp 10/05/23 1103 20     Temp 10/05/23 1103 98.4 F (36.9 C)     Temp Source 10/05/23 1103 Oral     SpO2 10/05/23 1103 98 %     Weight --      Height --      Head Circumference --      Peak Flow --      Pain Score 10/05/23 1101 2     Pain Loc --      Pain  Education --      Exclude from Growth Chart --    No data found.  Updated Vital Signs BP 120/78 (BP Location: Right Arm)   Pulse 76   Temp 98.4 F (36.9 C) (Oral)   Resp 20   SpO2 98%   Visual Acuity Right Eye Distance:   Left Eye Distance:   Bilateral Distance:    Right Eye Near:   Left Eye Near:    Bilateral Near:     Physical Exam   UC Treatments / Results  Labs (all labs ordered are listed, but only abnormal results are displayed) Labs Reviewed  POCT URINE DIPSTICK - Abnormal; Notable for the following components:      Result Value   Clarity, UA hazy (*)    Ketones, POC UA small (15) (*)    Spec Grav, UA >=1.030 (*)    Leukocytes, UA Small (1+) (*)    All other components within normal limits  URINE CULTURE  HIV ANTIBODY (ROUTINE TESTING W REFLEX)  RPR  HEPATITIS PANEL, ACUTE  CERVICOVAGINAL ANCILLARY ONLY    EKG   Radiology No results found.  Procedures Procedures (including critical care time)  Medications Ordered in UC Medications - No data to display  Initial Impression / Assessment and Plan / UC Course  I have reviewed the triage vital signs and the nursing notes.  Pertinent labs & imaging results that were available during my care of the patient were reviewed by me and considered in my medical decision making (see chart for details).  Plan of Care: Abdominal pain with urinary frequency, vaginitis and exposure to body fluids: UA is slightly abnormal but not clearly showing infection.  Culture sent.  Nitrofurantoin , 100 mg twice daily for 5 days.  Get plenty of fluids and rest.  If culture is negative we will be updated and advised to stop the antibiotic.  Will also check the portal for any other lab results.  Patient requested blood testing for STI and collected as self swab vaginally for STI.  Will contact her if we need to provide any treatment for any results  Follow-up if symptoms do not improve, worsen or new symptoms occur.  I reviewed  the plan of care with the patient and/or the patient's guardian.  The patient and/or guardian had time to ask questions and acknowledged that the questions were answered.  I provided instruction on symptoms or reasons to return here or to go to an ER, if symptoms/condition did not improve, worsened or if new symptoms occurred.  Final Clinical Impressions(s) / UC Diagnoses   Final diagnoses:  Dysuria  Vaginitis and vulvovaginitis  Suprapubic pain  Left lower quadrant  abdominal pain  Right lower quadrant abdominal pain  Personal history of exposure to potentially hazardous body fluids  Urinary frequency     Discharge Instructions      Abdominal pain with vaginitis and exposure to body fluids: Urinalysis is slightly abnormal but does not clearly show infection.  Culture sent.  Nitrofurantoin , 100 mg twice daily for 5 days.  Patient self collected an STI swab.  Will get blood testing for HIV, syphilis and hepatitis.  Will adjust her plan of care, if needed once all these test result.  Was advised that if results were negative they would be available on the portal for review.  If her urine culture is negative she will be called and told to stop the antibiotic.  Get plenty of fluids and rest.  Follow-up if symptoms do not improve, worsen or new symptoms occur.     ED Prescriptions     Medication Sig Dispense Auth. Provider   nitrofurantoin , macrocrystal-monohydrate, (MACROBID ) 100 MG capsule Take 1 capsule (100 mg total) by mouth 2 (two) times daily. 10 capsule Ival Domino, FNP      PDMP not reviewed this encounter.   Ival Domino, FNP 10/05/23 1141

## 2023-10-05 NOTE — ED Triage Notes (Addendum)
 Pt states for the last 3-4 days she has increased urinary frequency, and pressure. Pt denies hematuria, pelvic or low back pain. Pt has not taken anything for her symptoms.   Pt also request std testing. Pt states she is having vaginal irritation and increased discharge but denies odor or color.

## 2023-10-05 NOTE — Discharge Instructions (Addendum)
 Abdominal pain with vaginitis and exposure to body fluids: Urinalysis is slightly abnormal but does not clearly show infection.  Culture sent.  Nitrofurantoin , 100 mg twice daily for 5 days.  Patient self collected an STI swab.  Will get blood testing for HIV, syphilis and hepatitis.  Will adjust her plan of care, if needed once all these test result.  Was advised that if results were negative they would be available on the portal for review.  If her urine culture is negative she will be called and told to stop the antibiotic.  Get plenty of fluids and rest.  Follow-up if symptoms do not improve, worsen or new symptoms occur.

## 2023-10-06 ENCOUNTER — Ambulatory Visit (HOSPITAL_BASED_OUTPATIENT_CLINIC_OR_DEPARTMENT_OTHER): Payer: Self-pay | Admitting: Family Medicine

## 2023-10-06 LAB — CERVICOVAGINAL ANCILLARY ONLY
Bacterial Vaginitis (gardnerella): POSITIVE — AB
Candida Glabrata: NEGATIVE
Candida Vaginitis: NEGATIVE
Chlamydia: NEGATIVE
Comment: NEGATIVE
Comment: NEGATIVE
Comment: NEGATIVE
Comment: NEGATIVE
Comment: NEGATIVE
Comment: NORMAL
Neisseria Gonorrhea: POSITIVE — AB
Trichomonas: NEGATIVE

## 2023-10-06 LAB — URINE CULTURE: Culture: <10000 — AB

## 2023-10-06 LAB — RPR: RPR Ser Ql: NONREACTIVE

## 2023-10-06 LAB — HIV ANTIBODY (ROUTINE TESTING W REFLEX): HIV Screen 4th Generation wRfx: NONREACTIVE

## 2023-10-06 NOTE — Progress Notes (Signed)
 STI swab is positive for gonorrhea and bacterial vaginosis.  Patient needs to return for ceftriaxone 500 mg injection for the gonorrhea and needs metronidazole  for the bacterial vaginosis.  Patient could have metronidazole  500 mg twice daily for 7 days oral or metronidazole  gel vaginally twice daily for 7 days.    Please contact the patient and help her arrange to come back for these medications or send them into her pharmacy.  Thank

## 2023-10-06 NOTE — Progress Notes (Signed)
 RPR/syphilis and HIV test were negative.  Patient updated.  Patient is aware that if any of her tests are positive and require treatment, she will be called.  Otherwise negative test will be available on the portal.

## 2023-10-07 MED ORDER — METRONIDAZOLE 500 MG PO TABS: 500.0000 mg | ORAL_TABLET | Freq: Two times a day (BID) | ORAL | 0 refills | Status: AC

## 2023-10-08 ENCOUNTER — Encounter (HOSPITAL_BASED_OUTPATIENT_CLINIC_OR_DEPARTMENT_OTHER): Payer: Self-pay | Admitting: Emergency Medicine

## 2023-10-08 ENCOUNTER — Ambulatory Visit (HOSPITAL_BASED_OUTPATIENT_CLINIC_OR_DEPARTMENT_OTHER)
Admission: EM | Admit: 2023-10-08 | Discharge: 2023-10-08 | Disposition: A | Discharge status: Home / Self Care | Attending: Physician Assistant | Admitting: Physician Assistant

## 2023-10-08 DIAGNOSIS — A549 Gonococcal infection, unspecified: Secondary | ICD-10-CM

## 2023-10-08 MED ORDER — CEFTRIAXONE SODIUM 500 MG IJ SOLR
500.0000 mg | INTRAMUSCULAR | Status: DC
  Administered 2023-10-08: 500 mg via INTRAMUSCULAR

## 2023-10-08 NOTE — ED Triage Notes (Signed)
 Pt was told to come back to [facility] for a shot due to patient being positive for Gonorrhea.

## 2023-11-02 ENCOUNTER — Other Ambulatory Visit: Payer: Self-pay | Admitting: Physician Assistant

## 2023-11-02 ENCOUNTER — Encounter: Payer: Self-pay | Admitting: Physician Assistant

## 2023-11-02 DIAGNOSIS — R928 Other abnormal and inconclusive findings on diagnostic imaging of breast: Secondary | ICD-10-CM

## 2023-11-02 DIAGNOSIS — N6489 Other specified disorders of breast: Secondary | ICD-10-CM

## 2023-11-12 ENCOUNTER — Ambulatory Visit
Admission: EM | Admit: 2023-11-12 | Discharge: 2023-11-12 | Disposition: A | Discharge status: Home / Self Care | Attending: Emergency Medicine | Admitting: Emergency Medicine

## 2023-11-12 DIAGNOSIS — H6121 Impacted cerumen, right ear: Secondary | ICD-10-CM | POA: Diagnosis not present

## 2023-11-12 NOTE — Discharge Instructions (Addendum)
 See the attached information on earwax buildup.  Follow-up with your primary care provider as needed.

## 2023-11-12 NOTE — ED Triage Notes (Signed)
 Patient to Urgent Care with complaints of right sided ear fullness and muffled hearing. Some similar symptoms in her left ear.   Symptoms started last night.

## 2023-11-12 NOTE — ED Provider Notes (Signed)
 [name]    CSN: [id number] Arrival date & time: 11/12/23  1353      History   Chief Complaint Chief Complaint  Patient presents with   Ear Fullness    HPI Erika Davenport is a 43 y.o. female.  Patient presents with right ear sensation of fullness and muffled hearing since last night.  She attempted treatment with hydrogen peroxide and warm water  without relief.  She denies fever, ear drainage, ear pain, sore throat, cough.  She reports no pertinent medical history.  The history is provided by the patient and medical records.    History reviewed. No pertinent past medical history.  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      Home Medications    Prior to Admission medications   Medication Sig Start Date End Date Taking? Authorizing Provider  medroxyPROGESTERone (DEPO-PROVERA) 150 MG/ML injection Depo-Provera 150 mg/mL intramuscular suspension  Inject 1 mL every 3 months by intramuscular route.    [provider]  meloxicam (MOBIC) 15 MG tablet Take 15 mg by mouth daily. Patient not taking: Reported on 11/12/2023 05/21/21   [provider]  methocarbamol (ROBAXIN) 500 MG tablet Take 500 mg by mouth 4 (four) times daily. Patient not taking: Reported on 11/12/2023    [provider]  metroNIDAZOLE  (FLAGYL ) 500 MG tablet Take 1 tablet (500 mg total) by mouth 2 (two) times daily. Patient not taking: Reported on 11/12/2023 10/07/23   Vonna Sharlet POUR, MD  nitrofurantoin , macrocrystal-monohydrate, (MACROBID ) 100 MG capsule Take 1 capsule (100 mg total) by mouth 2 (two) times daily. Patient not taking:  Reported on 11/12/2023 10/05/23   Ival Domino, FNP  tiZANidine  (ZANAFLEX ) 4 MG tablet Take 1 tablet (4 mg total) by mouth at bedtime. 07/19/21   Christopher Savannah, PA-C    Family History History reviewed. No pertinent family history.  Social History Social History   Tobacco Use   Smoking status: Never   Smokeless tobacco: Never  Vaping Use   Vaping status: Never Used  Substance Use Topics   Alcohol use: Not Currently   Drug use: Never     Allergies   Pertussis vaccines   Review of Systems Review of Systems  Constitutional:  Negative for chills and fever.  HENT:  Positive for hearing loss. Negative for ear discharge, ear pain and sore throat.   Respiratory:  Negative for cough and shortness of breath.      Physical Exam Triage Vital Signs ED Triage Vitals  Encounter Vitals Group     BP 11/12/23 1454 107/63     Girls Systolic BP Percentile --      Girls Diastolic BP Percentile --      Boys Systolic BP Percentile --      Boys Diastolic BP Percentile --      Pulse Rate 11/12/23 1454 61     Resp 11/12/23 1454 19     Temp 11/12/23 1454 97.7 F (36.5 C)     Temp src --      SpO2 11/12/23 1454 99 %     Weight --      Height --  Head Circumference --      Peak Flow --      Pain Score 11/12/23 1451 0     Pain Loc --      Pain Education --      Exclude from Growth Chart --    No data found.  Updated Vital Signs BP 107/63   Pulse 61   Temp 97.7 F (36.5 C)   Resp 19   SpO2 99%   Visual Acuity Right Eye Distance:   Left Eye Distance:   Bilateral Distance:    Right Eye Near:   Left Eye Near:    Bilateral Near:     Physical Exam Constitutional:      General: She is not in acute distress. HENT:     Right Ear: There is impacted cerumen.     Left Ear: Tympanic membrane and ear canal normal.     Ears:     Comments: Right TM noted to be clear after cerumen removal.    Nose: Nose normal.     Mouth/Throat:     Mouth: Mucous membranes are moist.      Pharynx: Oropharynx is clear.  Cardiovascular:     Rate and Rhythm: Normal rate and regular rhythm.     Heart sounds: Normal heart sounds.  Pulmonary:     Effort: Pulmonary effort is normal. No respiratory distress.     Breath sounds: Normal breath sounds.  Neurological:     Mental Status: She is alert.      UC Treatments / Results  Labs (all labs ordered are listed, but only abnormal results are displayed) Labs Reviewed - No data to display  EKG   Radiology No results found.  Procedures Procedures (including critical care time)  Medications Ordered in UC Medications - No data to display  Initial Impression / Assessment and Plan / UC Course  I have reviewed the triage vital signs and the nursing notes.  Pertinent labs & imaging results that were available during my care of the patient were reviewed by me and considered in my medical decision making (see chart for details).    Right ear cerumen impaction.  Afebrile and vital signs are stable.  Cerumen impaction removed via irrigation by RN.  Patient reports complete relief of her symptoms.  TM noted to be clear after cerumen removal.  Education provided on earwax buildup.  Instructed her to follow-up with her PCP as needed.  She agrees to plan of care.  Final Clinical Impressions(s) / UC Diagnoses   Final diagnoses:  Impacted cerumen of right ear     Discharge Instructions      See the attached information on earwax buildup.  Follow-up with your primary care provider as needed.      ED Prescriptions   None    PDMP not reviewed this encounter.   Corlis Burnard DEL, NP 11/12/23 [phone]

## 2023-12-01 ENCOUNTER — Ambulatory Visit
Admission: RE | Admit: 2023-12-01 | Discharge: 2023-12-01 | Disposition: A | Discharge status: Home / Self Care | Source: Ambulatory Visit | Attending: Physician Assistant | Admitting: Physician Assistant

## 2023-12-01 ENCOUNTER — Ambulatory Visit
Admission: RE | Admit: 2023-12-01 | Discharge: 2023-12-01 | Disposition: A | Source: Ambulatory Visit | Attending: Physician Assistant | Admitting: Physician Assistant

## 2023-12-01 DIAGNOSIS — R928 Other abnormal and inconclusive findings on diagnostic imaging of breast: Secondary | ICD-10-CM

## 2023-12-01 DIAGNOSIS — N6489 Other specified disorders of breast: Secondary | ICD-10-CM
